# Patient Record
Sex: Male | Born: 1965 | Race: Black or African American | Hispanic: No | Marital: Single | Smoking: Never smoker
Health system: Southern US, Community
[De-identification: ages and names within clinical notes are randomized; demographics above are authoritative.]

## PROBLEM LIST (undated history)

## (undated) HISTORY — PX: OTHER SURGICAL HISTORY: SHX169

---

## 2014-12-11 DIAGNOSIS — Z87828 Personal history of other (healed) physical injury and trauma: Secondary | ICD-10-CM

## 2014-12-11 DIAGNOSIS — G3184 Mild cognitive impairment, so stated: Secondary | ICD-10-CM

## 2014-12-11 DIAGNOSIS — Z87820 Personal history of traumatic brain injury: Secondary | ICD-10-CM

## 2014-12-11 DIAGNOSIS — Z8679 Personal history of other diseases of the circulatory system: Secondary | ICD-10-CM

## 2014-12-11 HISTORY — DX: Mild cognitive impairment of uncertain or unknown etiology: G31.84

## 2014-12-11 HISTORY — DX: Personal history of traumatic brain injury: Z87.820

## 2014-12-11 HISTORY — DX: Personal history of other (healed) physical injury and trauma: Z87.828

## 2014-12-11 HISTORY — DX: Personal history of other diseases of the circulatory system: Z86.79

## 2015-11-25 ENCOUNTER — Encounter (HOSPITAL_COMMUNITY): Payer: Self-pay | Admitting: Emergency Medicine

## 2015-11-25 ENCOUNTER — Inpatient Hospital Stay (HOSPITAL_COMMUNITY)
Admission: EM | Admit: 2015-11-25 | Discharge: 2015-12-01 | DRG: 084 | Disposition: A | Discharge status: Home / Self Care | Payer: Self-pay | Attending: General Surgery | Admitting: General Surgery

## 2015-11-25 ENCOUNTER — Emergency Department (HOSPITAL_COMMUNITY): Payer: Self-pay

## 2015-11-25 DIAGNOSIS — S098XXA Other specified injuries of head, initial encounter: Secondary | ICD-10-CM | POA: Diagnosis present

## 2015-11-25 DIAGNOSIS — S069XAA Unspecified intracranial injury with loss of consciousness status unknown, initial encounter: Secondary | ICD-10-CM | POA: Diagnosis present

## 2015-11-25 DIAGNOSIS — R400 Somnolence: Secondary | ICD-10-CM | POA: Diagnosis present

## 2015-11-25 DIAGNOSIS — W19XXXA Unspecified fall, initial encounter: Secondary | ICD-10-CM | POA: Diagnosis present

## 2015-11-25 DIAGNOSIS — S065XAA Traumatic subdural hemorrhage with loss of consciousness status unknown, initial encounter: Secondary | ICD-10-CM

## 2015-11-25 DIAGNOSIS — S066X9A Traumatic subarachnoid hemorrhage with loss of consciousness of unspecified duration, initial encounter: Secondary | ICD-10-CM | POA: Diagnosis present

## 2015-11-25 DIAGNOSIS — S0101XA Laceration without foreign body of scalp, initial encounter: Secondary | ICD-10-CM | POA: Diagnosis present

## 2015-11-25 DIAGNOSIS — S065X9A Traumatic subdural hemorrhage with loss of consciousness of unspecified duration, initial encounter: Principal | ICD-10-CM | POA: Diagnosis present

## 2015-11-25 DIAGNOSIS — R10819 Abdominal tenderness, unspecified site: Secondary | ICD-10-CM

## 2015-11-25 DIAGNOSIS — I609 Nontraumatic subarachnoid hemorrhage, unspecified: Secondary | ICD-10-CM

## 2015-11-25 DIAGNOSIS — S069X9A Unspecified intracranial injury with loss of consciousness of unspecified duration, initial encounter: Secondary | ICD-10-CM | POA: Diagnosis present

## 2015-11-25 DIAGNOSIS — R413 Other amnesia: Secondary | ICD-10-CM | POA: Diagnosis present

## 2015-11-25 DIAGNOSIS — H539 Unspecified visual disturbance: Secondary | ICD-10-CM

## 2015-11-25 MED ORDER — TETANUS-DIPHTH-ACELL PERTUSSIS 5-2.5-18.5 LF-MCG/0.5 IM SUSP: 0.5000 mL | Freq: Once | INTRAMUSCULAR | Status: DC

## 2015-11-25 MED ORDER — SODIUM CHLORIDE 0.9 % IV BOLUS (SEPSIS)
1000.0000 mL | Freq: Once | INTRAVENOUS | Status: AC
  Administered 2015-11-26: 1000 mL via INTRAVENOUS

## 2015-11-25 NOTE — ED Provider Notes (Signed)
CSN: [id number]     Arrival date & time 11/25/15  2348 History  By signing my name below, I, Bradley Knox, attest that this documentation has been prepared under the direction and in the presence of [name], MD. Electronically Signed: [name], ED Scribe. 11/26/2015. 1:12 AM.   Chief Complaint  Patient presents with  . Altered Mental Status  . Loss of Consciousness   Level V caveat secondary to AMS.  The history is provided by the patient and the EMS personnel. No language interpreter was used.   Brought in by EMS, Bradley Knox. is a 49 y.o. male who presents to the Emergency Department complaining of AMS after being found unresponsive outside of his work. Pt has a laceration at the posterior scalp. EMS states that the pt initially reported that he fell, here he states that he cut himself shaving. Per EMS, the pt initially had pinpoint pupils and was sleepy with no significant change after 2 mg of Narcan. Pt denies taking any blood thinners. He also denies alcohol and drug use tonight.   History reviewed. No pertinent past medical history. History reviewed. No pertinent past surgical history. No family history on file. Social History  Substance Use Topics  . Smoking status: Never Smoker   . Smokeless tobacco: None  . Alcohol Use: No    Review of Systems  Unable to perform ROS: Mental status change    Allergies  Review of patient's allergies indicates no known allergies.  Home Medications   Prior to Admission medications   Not on File   SpO2 96% Physical Exam  Constitutional: Vital signs are normal. He appears well-developed and well-nourished.  Non-toxic appearance. He does not appear ill. No distress.  HENT:  Head: Normocephalic.  Nose: Rhinorrhea present.  Mouth/Throat: Oropharynx is clear and moist. No oropharyngeal exudate.  5 cm laceration to posterior scalp with small hematoma. No active bleeding.  Eyes: Conjunctivae and EOM are normal. Pupils are  equal, round, and reactive to light. No scleral icterus.  Neck: Normal range of motion. Neck supple. No tracheal deviation, no edema, no erythema and normal range of motion present. No thyroid mass and no thyromegaly present.  Cardiovascular: Normal rate, regular rhythm, S1 normal, S2 normal, normal heart sounds, intact distal pulses and normal pulses.  Exam reveals no gallop and no friction rub.   No murmur heard. Pulmonary/Chest: Effort normal and breath sounds normal. No respiratory distress. He has no wheezes. He has no rhonchi. He has no rales.  Abdominal: Soft. Normal appearance and bowel sounds are normal. He exhibits no distension, no ascites and no mass. There is no hepatosplenomegaly. There is no tenderness. There is no rebound, no guarding and no CVA tenderness.  Musculoskeletal: Normal range of motion. He exhibits no edema or tenderness.  Lymphadenopathy:    He has no cervical adenopathy.  Neurological: He is alert. He has normal strength. No cranial nerve deficit or sensory deficit.  Drowsy but easily arousable and able to follow all commands and moves all extremities  Skin: Skin is warm, dry and intact. No petechiae and no rash noted. He is not diaphoretic. No erythema. No pallor.  Psychiatric: He has a normal mood and affect. His behavior is normal. Judgment normal.  Nursing note and vitals reviewed.   ED Course  Procedures (including critical care time) DIAGNOSTIC STUDIES: Oxygen Saturation is 96% on RA,  normal by my interpretation.    COORDINATION OF CARE: 11:56 PM Treatment plan includes lab work, EKG,  CT head, CXR, and Tdap .  1:05 AM-Consult complete with Dr. Venetia Maxon (Neurosurgery). Patient case explained and discussed. Call ended at 1:05 AM.  1:11 AM-Consult complete with Dr. Janee Morn (General Surgery). Patient case explained and discussed. Agrees to admit patient for further evaluation and treatment. Call ended at 1:12 AM.   Labs Review Labs Reviewed  CBC WITH  DIFFERENTIAL/PLATELET - Abnormal; Notable for the following:    WBC 14.0 (*)    Neutro Abs 8.7 (*)    Lymphs Abs 4.6 (*)    All other components within normal limits  COMPREHENSIVE METABOLIC PANEL - Abnormal; Notable for the following:    Potassium 3.1 (*)    Glucose, Bld 203 (*)    Calcium 8.8 (*)    All other components within normal limits  ACETAMINOPHEN LEVEL - Abnormal; Notable for the following:    Acetaminophen (Tylenol), Serum <10 (*)    All other components within normal limits  I-STAT CG4 LACTIC ACID, ED - Abnormal; Notable for the following:    Lactic Acid, Venous 2.30 (*)    All other components within normal limits  LIPASE, BLOOD  PROTIME-INR  ETHANOL  SALICYLATE LEVEL  URINALYSIS, ROUTINE W REFLEX MICROSCOPIC (NOT AT [facility])  URINE RAPID DRUG SCREEN, HOSP PERFORMED  I-STAT TROPOININ, ED  CBG MONITORING, ED    Imaging Review Ct Head Wo Contrast  11/26/2015  CLINICAL DATA:  Patient found unresponsive, with scalp laceration. Initial encounter. EXAM: CT HEAD WITHOUT CONTRAST TECHNIQUE: Contiguous axial images were obtained from the base of the skull through the vertex without intravenous contrast. COMPARISON:  None. FINDINGS: There appears to be minimal subdural blood tracking overlying the left frontal and temporal lobes, measuring up to 4 mm in thickness, and minimal subdural blood along the anterior falx cerebri. There is also minimal subarachnoid blood at the left frontal lobe. This is compatible with contrecoup injury. No significant mass effect or midline shift is seen at this time. The posterior fossa, including the cerebellum, brainstem and fourth ventricle, is within normal limits. The third and lateral ventricles, and basal ganglia are unremarkable in appearance. There is no evidence of fracture; visualized osseous structures are unremarkable in appearance. The orbits are within normal limits. The paranasal sinuses and mastoid air cells are well-aerated. Soft tissue  swelling is noted overlying the high right posterior parietal calvarium. IMPRESSION: 1. Minimal subdural blood overlying the left frontal and temporal lobes, measuring up to 4 mm in thickness, and likely minimal subdural blood along the anterior falx cerebri. 2. Minimal subarachnoid blood at the left frontal lobe. Findings are compatible with contrecoup injury, given the right posterior parietal scalp wound. 3. Soft tissue swelling overlying the high right posterior parietal calvarium. Critical Value/emergent results were called by telephone at the time of interpretation on 11/26/2015 at 12:27 am to Dr. [name], who verbally acknowledged these results. Electronically Signed   By: Roanna Raider M.D.   On: 11/26/2015 00:29   Dg Chest Portable 1 View  11/26/2015  CLINICAL DATA:  Altered level of consciousness, acute onset. Initial encounter. EXAM: PORTABLE CHEST 1 VIEW COMPARISON:  None. FINDINGS: The lungs are well-aerated and clear. There is no evidence of focal opacification, pleural effusion or pneumothorax. The cardiomediastinal silhouette is borderline normal in size. No acute osseous abnormalities are seen. IMPRESSION: No acute cardiopulmonary process seen. Electronically Signed   By: Roanna Raider M.D.   On: 11/26/2015 00:34   I have personally reviewed and evaluated these images and lab results as part  of my medical decision-making.   EKG Interpretation   Date/Time:  Thursday November 25 2015 23:54:55 EST Ventricular Rate:  83 PR Interval:  189 QRS Duration: 91 QT Interval:  384 QTC Calculation: 451 R Axis:   77 Text Interpretation:  Sinus rhythm Left ventricular hypertrophy No old  tracing to compare Confirmed by Erroll Lunani, Abdulraheem Pineo Ayokunle [phone]([id number]) on  11/26/2015 12:05:23 AM      MDM   Final diagnoses:  None    00:02- Patient presents to the ED after EMS found him unresponsive outside of his work.  I have concern for intracranial injury and will obtain stat CT scan.  Tetanus  updated.  Labs pending.  01:26 - Labs are unremarkable.  CT scan shows SDH and SAH.  Dr. Noland FordyceStern recs for admission to gen surg, Dr. Janee Mornhompson has accepted the patient.  LACERATION REPAIR Performed by: Tomasita CrumbleNI,Bret Stamour Authorized byTomasita Crumble: Danielle Lento Consent: Verbal consent obtained. Risks and benefits: risks, benefits and alternatives were discussed Consent given by: patient Patient identity confirmed: provided demographic data Prepped and Draped in normal sterile fashion Wound explored  Laceration Location: posterior scalp  Laceration Length: 5cm  No Foreign Bodies seen or palpated   Irrigation method: syringe Amount of cleaning: standard  Skin closure: staples  Number of staples: 2   Patient tolerance: Patient tolerated the procedure well with no immediate complications.   CRITICAL CARE Performed by: Tomasita CrumbleNI,Joanell Cressler   Total critical care time: 40 minutes - SDH, SAH  Critical care time was exclusive of separately billable procedures and treating other patients.  Critical care was necessary to treat or prevent imminent or life-threatening deterioration.  Critical care was time spent personally by me on the following activities: development of treatment plan with patient and/or surrogate as well as nursing, discussions with consultants, evaluation of patient's response to treatment, examination of patient, obtaining history from patient or surrogate, ordering and performing treatments and interventions, ordering and review of laboratory studies, ordering and review of radiographic studies, pulse oximetry and re-evaluation of patient's condition.    I personally performed the services described in this documentation, which was scribed in my presence. The recorded information has been reviewed and is accurate.     Tomasita CrumbleAdeleke Gaelyn Tukes, MD 11/26/15 [id number]
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
## 2015-11-26 ENCOUNTER — Emergency Department (HOSPITAL_COMMUNITY): Payer: Self-pay

## 2015-11-26 ENCOUNTER — Inpatient Hospital Stay (HOSPITAL_COMMUNITY): Payer: MEDICAID

## 2015-11-26 ENCOUNTER — Inpatient Hospital Stay (HOSPITAL_COMMUNITY): Payer: Self-pay

## 2015-11-26 DIAGNOSIS — S069X9A Unspecified intracranial injury with loss of consciousness of unspecified duration, initial encounter: Secondary | ICD-10-CM | POA: Diagnosis present

## 2015-11-26 DIAGNOSIS — S069XAA Unspecified intracranial injury with loss of consciousness status unknown, initial encounter: Secondary | ICD-10-CM | POA: Diagnosis present

## 2015-11-26 LAB — CBC
HCT: 38.4 % — ABNORMAL LOW (ref 39.0–52.0)
Hemoglobin: 12.9 g/dL — ABNORMAL LOW (ref 13.0–17.0)
MCH: 29.7 pg (ref 26.0–34.0)
MCHC: 33.6 g/dL (ref 30.0–36.0)
MCV: 88.3 fL (ref 78.0–100.0)
PLATELETS: 139 10*3/uL — AB (ref 150–400)
RBC: 4.35 MIL/uL (ref 4.22–5.81)
RDW: 12.8 % (ref 11.5–15.5)
WBC: 13 10*3/uL — AB (ref 4.0–10.5)

## 2015-11-26 LAB — COMPREHENSIVE METABOLIC PANEL
ALK PHOS: 58 U/L (ref 38–126)
ALT: 21 U/L (ref 17–63)
ANION GAP: 10 (ref 5–15)
AST: 30 U/L (ref 15–41)
Albumin: 4 g/dL (ref 3.5–5.0)
BUN: 13 mg/dL (ref 6–20)
CALCIUM: 8.8 mg/dL — AB (ref 8.9–10.3)
CHLORIDE: 101 mmol/L (ref 101–111)
CO2: 25 mmol/L (ref 22–32)
CREATININE: 1 mg/dL (ref 0.61–1.24)
Glucose, Bld: 203 mg/dL — ABNORMAL HIGH (ref 65–99)
Potassium: 3.1 mmol/L — ABNORMAL LOW (ref 3.5–5.1)
Sodium: 136 mmol/L (ref 135–145)
Total Bilirubin: 1.2 mg/dL (ref 0.3–1.2)
Total Protein: 7 g/dL (ref 6.5–8.1)

## 2015-11-26 LAB — CBC WITH DIFFERENTIAL/PLATELET
Basophils Absolute: 0 10*3/uL (ref 0.0–0.1)
Basophils Relative: 0 %
EOS PCT: 1 %
Eosinophils Absolute: 0.1 10*3/uL (ref 0.0–0.7)
HCT: 39.7 % (ref 39.0–52.0)
Hemoglobin: 13.3 g/dL (ref 13.0–17.0)
LYMPHS ABS: 4.6 10*3/uL — AB (ref 0.7–4.0)
LYMPHS PCT: 33 %
MCH: 29.8 pg (ref 26.0–34.0)
MCHC: 33.5 g/dL (ref 30.0–36.0)
MCV: 89 fL (ref 78.0–100.0)
MONOS PCT: 5 %
Monocytes Absolute: 0.7 10*3/uL (ref 0.1–1.0)
Neutro Abs: 8.7 10*3/uL — ABNORMAL HIGH (ref 1.7–7.7)
Neutrophils Relative %: 61 %
PLATELETS: 152 10*3/uL (ref 150–400)
RBC: 4.46 MIL/uL (ref 4.22–5.81)
RDW: 12.7 % (ref 11.5–15.5)
WBC: 14 10*3/uL — AB (ref 4.0–10.5)

## 2015-11-26 LAB — BASIC METABOLIC PANEL
ANION GAP: 9 (ref 5–15)
BUN: 10 mg/dL (ref 6–20)
CALCIUM: 8.6 mg/dL — AB (ref 8.9–10.3)
CO2: 26 mmol/L (ref 22–32)
CREATININE: 0.88 mg/dL (ref 0.61–1.24)
Chloride: 99 mmol/L — ABNORMAL LOW (ref 101–111)
Glucose, Bld: 141 mg/dL — ABNORMAL HIGH (ref 65–99)
Potassium: 3 mmol/L — ABNORMAL LOW (ref 3.5–5.1)
SODIUM: 134 mmol/L — AB (ref 135–145)

## 2015-11-26 LAB — URINALYSIS, ROUTINE W REFLEX MICROSCOPIC
BILIRUBIN URINE: NEGATIVE
GLUCOSE, UA: NEGATIVE mg/dL
KETONES UR: 40 mg/dL — AB
Leukocytes, UA: NEGATIVE
Nitrite: NEGATIVE
PH: 7.5 (ref 5.0–8.0)
Protein, ur: NEGATIVE mg/dL
Specific Gravity, Urine: 1.035 — ABNORMAL HIGH (ref 1.005–1.030)

## 2015-11-26 LAB — PROTIME-INR
INR: 1.11 (ref 0.00–1.49)
PROTHROMBIN TIME: 14.5 s (ref 11.6–15.2)

## 2015-11-26 LAB — I-STAT TROPONIN, ED: Troponin i, poc: 0 ng/mL (ref 0.00–0.08)

## 2015-11-26 LAB — I-STAT CG4 LACTIC ACID, ED: LACTIC ACID, VENOUS: 2.3 mmol/L — AB (ref 0.5–2.0)

## 2015-11-26 LAB — URINE MICROSCOPIC-ADD ON

## 2015-11-26 LAB — LIPASE, BLOOD: LIPASE: 23 U/L (ref 11–51)

## 2015-11-26 LAB — RAPID URINE DRUG SCREEN, HOSP PERFORMED
Amphetamines: NOT DETECTED
BARBITURATES: NOT DETECTED
Benzodiazepines: NOT DETECTED
Cocaine: NOT DETECTED
Opiates: NOT DETECTED
Tetrahydrocannabinol: NOT DETECTED

## 2015-11-26 LAB — MRSA PCR SCREENING: MRSA by PCR: NEGATIVE

## 2015-11-26 LAB — ETHANOL

## 2015-11-26 LAB — ACETAMINOPHEN LEVEL

## 2015-11-26 LAB — SALICYLATE LEVEL

## 2015-11-26 MED ORDER — POTASSIUM CHLORIDE IN NACL 20-0.9 MEQ/L-% IV SOLN
INTRAVENOUS | Status: DC
  Administered 2015-11-26: 04:00:00 via INTRAVENOUS
  Filled 2015-11-26 (×2): qty 1000

## 2015-11-26 MED ORDER — ONDANSETRON HCL 4 MG/2ML IJ SOLN
4.0000 mg | Freq: Once | INTRAMUSCULAR | Status: AC
  Administered 2015-11-26: 4 mg via INTRAMUSCULAR

## 2015-11-26 MED ORDER — POTASSIUM CHLORIDE 10 MEQ/50ML IV SOLN: 10.0000 meq | INTRAVENOUS | Status: DC

## 2015-11-26 MED ORDER — OXYCODONE HCL 5 MG PO TABS
10.0000 mg | ORAL_TABLET | ORAL | Status: DC | PRN
  Administered 2015-11-28 – 2015-12-01 (×8): 10 mg via ORAL
  Filled 2015-11-26 (×8): qty 2

## 2015-11-26 MED ORDER — POTASSIUM CHLORIDE 10 MEQ/100ML IV SOLN
10.0000 meq | INTRAVENOUS | Status: DC
  Administered 2015-11-26: 10 meq via INTRAVENOUS
  Filled 2015-11-26: qty 100

## 2015-11-26 MED ORDER — PANTOPRAZOLE SODIUM 40 MG IV SOLR
40.0000 mg | Freq: Every day | INTRAVENOUS | Status: DC
  Administered 2015-11-27 – 2015-11-28 (×2): 40 mg via INTRAVENOUS
  Filled 2015-11-26 (×2): qty 40

## 2015-11-26 MED ORDER — ONDANSETRON HCL 4 MG/2ML IJ SOLN: 4.0000 mg | Freq: Four times a day (QID) | INTRAMUSCULAR | Status: DC | PRN

## 2015-11-26 MED ORDER — POTASSIUM CHLORIDE CRYS ER 10 MEQ PO TBCR
30.0000 meq | EXTENDED_RELEASE_TABLET | Freq: Once | ORAL | Status: AC
  Administered 2015-11-26: 30 meq via ORAL
  Filled 2015-11-26: qty 1

## 2015-11-26 MED ORDER — IOHEXOL 300 MG/ML  SOLN
100.0000 mL | Freq: Once | INTRAMUSCULAR | Status: AC | PRN
  Administered 2015-11-26: 100 mL via INTRAVENOUS

## 2015-11-26 MED ORDER — POTASSIUM CHLORIDE IN NACL 20-0.9 MEQ/L-% IV SOLN
INTRAVENOUS | Status: DC
  Administered 2015-11-29: 02:00:00 via INTRAVENOUS
  Filled 2015-11-26 (×6): qty 1000

## 2015-11-26 MED ORDER — ACETAMINOPHEN 325 MG PO TABS: 650.0000 mg | ORAL_TABLET | ORAL | Status: DC | PRN

## 2015-11-26 MED ORDER — OXYCODONE HCL 5 MG PO TABS
5.0000 mg | ORAL_TABLET | ORAL | Status: DC | PRN
  Administered 2015-11-27 – 2015-12-01 (×5): 5 mg via ORAL
  Filled 2015-11-26 (×7): qty 1

## 2015-11-26 MED ORDER — POTASSIUM CHLORIDE CRYS ER 20 MEQ PO TBCR
30.0000 meq | EXTENDED_RELEASE_TABLET | Freq: Two times a day (BID) | ORAL | Status: AC
  Administered 2015-11-26 – 2015-11-27 (×2): 30 meq via ORAL
  Filled 2015-11-26 (×2): qty 1

## 2015-11-26 MED ORDER — ONDANSETRON HCL 4 MG PO TABS: 4.0000 mg | ORAL_TABLET | Freq: Four times a day (QID) | ORAL | Status: DC | PRN

## 2015-11-26 MED ORDER — MORPHINE SULFATE (PF) 2 MG/ML IV SOLN
2.0000 mg | INTRAVENOUS | Status: DC | PRN
  Administered 2015-11-26 – 2015-11-30 (×9): 2 mg via INTRAVENOUS
  Filled 2015-11-26 (×9): qty 1

## 2015-11-26 MED ORDER — PANTOPRAZOLE SODIUM 40 MG PO TBEC
40.0000 mg | DELAYED_RELEASE_TABLET | Freq: Every day | ORAL | Status: DC
  Administered 2015-11-29 – 2015-12-01 (×3): 40 mg via ORAL
  Filled 2015-11-26 (×4): qty 1

## 2015-11-26 MED ORDER — MUPIROCIN 2 % EX OINT
TOPICAL_OINTMENT | CUTANEOUS | Status: AC
  Filled 2015-11-26: qty 22

## 2015-11-26 MED ORDER — BACITRACIN-NEOMYCIN-POLYMYXIN OINTMENT TUBE
1.0000 "application " | TOPICAL_OINTMENT | Freq: Every day | CUTANEOUS | Status: DC
  Administered 2015-11-26 – 2015-12-01 (×6): 1 via TOPICAL
  Filled 2015-11-26 (×2): qty 15

## 2015-11-26 MED ORDER — SODIUM CHLORIDE 0.9 % IV SOLN
500.0000 mg | Freq: Two times a day (BID) | INTRAVENOUS | Status: DC
  Administered 2015-11-26 – 2015-11-29 (×6): 500 mg via INTRAVENOUS
  Filled 2015-11-26 (×10): qty 5

## 2015-11-26 MED ORDER — SODIUM CHLORIDE 0.9 % IV SOLN
1000.0000 mg | Freq: Once | INTRAVENOUS | Status: AC
  Administered 2015-11-26: 1000 mg via INTRAVENOUS
  Filled 2015-11-26: qty 10
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
## 2015-11-26 NOTE — Progress Notes (Signed)
CT reviewed Expected evolution of left frontal contusion No need to repeat CT again unless neurological worsening Continue Keppra for 7 days
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
## 2015-11-26 NOTE — Progress Notes (Signed)
SLP Cancellation Note  Patient Details Name: Janene HarveyRay Loring Jr. MRN: [id number] DOB: [birth year]   Cancelled treatment:       Reason Eval/Treat Not Completed: pt in procedure in room   Blenda MountsCouture, Norine Reddington Laurice 11/26/2015, 3:37 PM
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
## 2015-11-26 NOTE — Progress Notes (Signed)
Bradley Knox, sister, called gave this nurse date of birth and background. Confirmed relationship with pt. Pt stated it was ok to update his sister. Sister updated, best contact is [phone] (cell)

## 2015-11-26 NOTE — ED Notes (Signed)
Call phone and ups ID given to [employer] at this time. Will bring to floor once finished. All other pt belongings taken to floor with pt.l

## 2015-11-26 NOTE — Plan of Care (Signed)
Problem: Phase I Progression Outcomes Goal: Pain controlled with appropriate interventions Outcome: Completed/Met Date Met:  11/26/15 Patient refuses all pain medications.
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
## 2015-11-27 LAB — CBC WITH DIFFERENTIAL/PLATELET
Basophils Absolute: 0 10*3/uL (ref 0.0–0.1)
Basophils Relative: 0 %
Eosinophils Absolute: 0 10*3/uL (ref 0.0–0.7)
Eosinophils Relative: 0 %
HEMATOCRIT: 37.6 % — AB (ref 39.0–52.0)
Hemoglobin: 13.1 g/dL (ref 13.0–17.0)
LYMPHS PCT: 14 %
Lymphs Abs: 1.4 10*3/uL (ref 0.7–4.0)
MCH: 30.7 pg (ref 26.0–34.0)
MCHC: 34.8 g/dL (ref 30.0–36.0)
MCV: 88.1 fL (ref 78.0–100.0)
MONO ABS: 0.9 10*3/uL (ref 0.1–1.0)
MONOS PCT: 9 %
NEUTROS ABS: 7.8 10*3/uL — AB (ref 1.7–7.7)
Neutrophils Relative %: 77 %
Platelets: 140 10*3/uL — ABNORMAL LOW (ref 150–400)
RBC: 4.27 MIL/uL (ref 4.22–5.81)
RDW: 13 % (ref 11.5–15.5)
WBC: 10.1 10*3/uL (ref 4.0–10.5)

## 2015-11-27 LAB — BASIC METABOLIC PANEL
ANION GAP: 6 (ref 5–15)
BUN: 8 mg/dL (ref 6–20)
CALCIUM: 8.8 mg/dL — AB (ref 8.9–10.3)
CO2: 25 mmol/L (ref 22–32)
Chloride: 103 mmol/L (ref 101–111)
Creatinine, Ser: 0.96 mg/dL (ref 0.61–1.24)
GFR calc Af Amer: >60 mL/min (ref >60)
GFR calc non Af Amer: >60 mL/min (ref >60)
GLUCOSE: 124 mg/dL — AB (ref 65–99)
POTASSIUM: 4 mmol/L (ref 3.5–5.1)
Sodium: 134 mmol/L — ABNORMAL LOW (ref 135–145)
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
## 2015-11-28 ENCOUNTER — Inpatient Hospital Stay (HOSPITAL_COMMUNITY): Payer: Self-pay
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
## 2015-11-29 ENCOUNTER — Inpatient Hospital Stay (HOSPITAL_COMMUNITY): Payer: Self-pay

## 2015-11-29 ENCOUNTER — Encounter (HOSPITAL_COMMUNITY): Payer: Self-pay | Admitting: Radiology

## 2015-11-29 MED ORDER — LEVETIRACETAM 500 MG PO TABS
500.0000 mg | ORAL_TABLET | Freq: Two times a day (BID) | ORAL | Status: DC
  Administered 2015-11-29 – 2015-12-01 (×4): 500 mg via ORAL
  Filled 2015-11-29 (×4): qty 1
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
## 2015-11-29 NOTE — Progress Notes (Signed)
Patient ID: Bradley Harveyay Stanback Jr., male   DOB: 1966/05/21, 49 y.o.   MRN: [id number]    Subjective: No complaints, remains amnestic to event  Objective: Vital signs in last 24 hours: Temp:  [98.2 F (36.8 C)-99.1 F (37.3 C)] 98.2 F (36.8 C) (12/19 0602) Pulse Rate:  [65-78] 66 (12/19 0602) Resp:  [18] 18 (12/19 0602) BP: (105-156)/(57-72) 156/57 mmHg (12/19 0602) SpO2:  [96 %-99 %] 96 % (12/19 0602) Last BM Date: 11/26/15  Intake/Output from previous day: 12/18 0701 - 12/19 0700 In: -  Out: 600 [Urine:600] Intake/Output this shift:    General appearance: cooperative Head: abrasion/lac post scalp with minimal drainage Resp: clear to auscultation bilaterally Cardio: regular rate and rhythm GI: soft, NT, ND Neuro: awake, F/C, speech fluent, MAE  Lab Results: CBC   Recent Labs  11/27/15 0413  WBC 10.1  HGB 13.1  HCT 37.6*  PLT 140*   BMET  Recent Labs  11/27/15 0413  NA 134*  K 4.0  CL 103  CO2 25  GLUCOSE 124*  BUN 8  CREATININE 0.96  CALCIUM 8.8*   PT/INR No results for input(s): LABPROT, INR in the last 72 hours. ABG No results for input(s): PHART, HCO3 in the last 72 hours.  Invalid input(s): PCO2, PO2  Studies/Results: Ct Head Wo Contrast  11/28/2015  CLINICAL DATA:  Subdural hematoma. New onset visual changes and posterior headache. EXAM: CT HEAD WITHOUT CONTRAST TECHNIQUE: Contiguous axial images were obtained from the base of the skull through the vertex without intravenous contrast. COMPARISON:  11/26/2015 FINDINGS: The left frontal lobe hemorrhagic contusion measures 8 mm and is unchanged when compared with the previous exam, image number 7 of series 2. Surrounding low attenuation edema is again noted. The adjacent subarachnoid hemorrhage is stable to improved in the interval. Improved appearance of subdural hemorrhage overlying the left frontal and temporal lobes. No new areas of hemorrhage identified. No significant midline shift. Ventricular  volumes are normal. The paranasal sinuses are clear. The mastoid air cells are clear. Skin staples are noted overlying the posterior scalp. IMPRESSION: 1. Stable appearance of hemorrhagic contusion involving the left frontal lobe. 2. Decrease in subdural hemorrhage overlying the left frontal and temporal lobes. 3. No new areas of hemorrhage and no evidence for acute brain infarct. Electronically Signed   By: Signa Kellaylor  Stroud M.D.   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 Keppra for 7d per NS Posterior scalp lac/abrasion - closed by ED. Local care. FEN - advance to reg diet and SL IV VTE - PAS, will check with NS regarding Lovenox DIspo - he does not have anyone that can provide 24h assist. Continue therapies until he can safely go home alone.   LOS: 3 days    Violeta GelinasBurke Jaicee Michelotti, MD, MPH, FACS Trauma: [phone] General Surgery: [phone]  11/29/2015
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
## 2015-11-30 MED ORDER — ENOXAPARIN SODIUM 40 MG/0.4ML ~~LOC~~ SOLN
40.0000 mg | SUBCUTANEOUS | Status: DC
  Administered 2015-11-30 – 2015-12-01 (×2): 40 mg via SUBCUTANEOUS
  Filled 2015-11-30 (×2): qty 0.4
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
## 2015-11-30 NOTE — Progress Notes (Signed)
CT reviewed Stable/evolving intracranial hemorrhage Ok for prophylactic dose lovenox

## 2015-11-30 NOTE — Progress Notes (Signed)
Pt continues to complain about left eye pain that he has had since being admitted.  Pain medications given, and repeat CT was completed earlier tonight.  Will continue to monitor.   Estanislado EmmsAshley Schwarz, RN
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
## 2015-11-30 NOTE — Progress Notes (Addendum)
Patient has not been the most cooperative affect with PT, OT and has become demanding concerning his pain medications showing signs of anger. His pain medications have been administered in a timely manner with momentary delays related to other staff being unavailable or myself being involved with other patients.

## 2015-11-30 NOTE — Progress Notes (Signed)
Trauma Service Note  Subjective: Patient complaining of eye pain.  Not sure wht is causing his eye pain.  Objective: Vital signs in last 24 hours: Temp:  [98.1 F (36.7 C)-98.5 F (36.9 C)] 98.2 F (36.8 C) (12/20 0522) Pulse Rate:  [54-60] 55 (12/20 0522) Resp:  [18] 18 (12/20 0127) BP: (110-136)/(60-80) 110/60 mmHg (12/20 0522) SpO2:  [97 %-100 %] 100 % (12/20 0522) Last BM Date: 11/26/15  Intake/Output from previous day: 12/19 0701 - 12/20 0700 In: -  Out: 600 [Urine:600] Intake/Output this shift:    General: No acute distress.  Lungs: Clear  Abd: Benign  Extremities: no changes and no clinical signs or symptoms of DVT  Neuro: Slow to respond.  Seems oriented.  Still having eye pain and headaches.  Lab Results: CBC  No results for input(s): WBC, HGB, HCT, PLT in the last 72 hours. BMET No results for input(s): NA, K, CL, CO2, GLUCOSE, BUN, CREATININE, CALCIUM in the last 72 hours. PT/INR No results for input(s): LABPROT, INR in the last 72 hours. ABG No results for input(s): PHART, HCO3 in the last 72 hours.  Invalid input(s): PCO2, PO2  Studies/Results: Ct Head Wo Contrast  11/29/2015  CLINICAL DATA:  Followup head trauma. EXAM: CT HEAD WITHOUT CONTRAST TECHNIQUE: Contiguous axial images were obtained from the base of the skull through the vertex without intravenous contrast. COMPARISON:  Yesterday FINDINGS: Right parietal scalp staples with diffusing scalp contusion. There is stable diastasis of the right lambdoid suture, nondepressed. Bilateral inferior frontal hemorrhagic contusion, more extensive on the left where there is a 9 mm stable parenchymal hemorrhage. Extra-axial hemorrhage seen at presentation is no longer visualized. No hydrocephalus, infarct, shift, or new site of hemorrhage. IMPRESSION: 1. No new/acute finding. 2. Stable inferior bifrontal hemorrhagic contusion. 3. Right lambdoid suture diastasis, nondepressed. Electronically Signed   By: Marnee SpringJonathon   Watts M.D.   On: 11/29/2015 22:45   Ct Head Wo Contrast  11/28/2015  CLINICAL DATA:  Subdural hematoma. New onset visual changes and posterior headache. EXAM: CT HEAD WITHOUT CONTRAST TECHNIQUE: Contiguous axial images were obtained from the base of the skull through the vertex without intravenous contrast. COMPARISON:  11/26/2015 FINDINGS: The left frontal lobe hemorrhagic contusion measures 8 mm and is unchanged when compared with the previous exam, image number 7 of series 2. Surrounding low attenuation edema is again noted. The adjacent subarachnoid hemorrhage is stable to improved in the interval. Improved appearance of subdural hemorrhage overlying the left frontal and temporal lobes. No new areas of hemorrhage identified. No significant midline shift. Ventricular volumes are normal. The paranasal sinuses are clear. The mastoid air cells are clear. Skin staples are noted overlying the posterior scalp. IMPRESSION: 1. Stable appearance of hemorrhagic contusion involving the left frontal lobe. 2. Decrease in subdural hemorrhage overlying the left frontal and temporal lobes. 3. No new areas of hemorrhage and no evidence for acute brain infarct. Electronically Signed   By: Signa Kellaylor  Stroud M.D.   On: 11/28/2015 17:18    Anti-infectives: Anti-infectives    None      Assessment/Plan: s/p  Need to progress in patient's recovery and try to get safe, adequate disposition.  LOS: 4 days   Marta LamasJames O. Gae BonWyatt, III, MD, FACS [phone] Trauma Surgeon 11/30/2015

## 2015-11-30 NOTE — Progress Notes (Signed)
PT Cancellation Note  Patient Details Name: Bradley HarveyRay Nishida Jr. MRN: [id number] DOB: [birth year]   Cancelled Treatment:    Reason Eval/Treat Not Completed: Patient reports "I just did all that walking and talking about numbers with the other person." Patient unable to recall their name and thought it was a PT. ?if OT has recently seen. Patient would not get OOB for further mobility training.   Bradley Knox 11/30/2015, 2:54 PM  Pager [phone]
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
## 2015-12-01 DIAGNOSIS — S098XXA Other specified injuries of head, initial encounter: Secondary | ICD-10-CM | POA: Diagnosis present

## 2015-12-01 DIAGNOSIS — S0101XA Laceration without foreign body of scalp, initial encounter: Secondary | ICD-10-CM | POA: Diagnosis present

## 2015-12-01 MED ORDER — LEVETIRACETAM 500 MG PO TABS: 500.0000 mg | ORAL_TABLET | Freq: Two times a day (BID) | ORAL | Status: DC

## 2015-12-01 MED ORDER — OXYCODONE-ACETAMINOPHEN 5-325 MG PO TABS: 1.0000 | ORAL_TABLET | ORAL | Status: AC | PRN

## 2015-12-01 NOTE — Progress Notes (Signed)
Discharge orders received, Pt for discharge. IV d/c'd. D/c instructions and RX given with verbalized understanding. . Staff bought pt downstairs via wheelchair. 12/01/15 1308
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
## 2015-12-01 NOTE — Progress Notes (Signed)
Physical Therapy Treatment Patient Details Name: Bradley Knox. MRN: [id number] DOB: 09/24/1966 Today's Date: 12/01/2015    History of Present Illness pt presents after being found down.  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  Only known PMH is ACDF shown on scans.      PT Comments    Patient continues with delayed response to cognitive tasks, however with brisk following of one step commands for Berg Balance testing (scored 51/56) and Dynamic Gait Index (22/24). 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    Follow Up Recommendations  Outpatient PT;Supervision - Intermittent (OP for return to work)     Equipment Recommendations  None recommended by PT    Recommendations for Other Services       Precautions / Restrictions Precautions Precautions: Fall Restrictions Weight Bearing Restrictions: No    Mobility  Bed Mobility Overal bed mobility: Modified Independent             General bed mobility comments: incr time; x2 due to returning to bed (with chair alarm beeping) while PT went to get blankets  Transfers Overall transfer level: Independent     Sit to Stand: Independent         General transfer comment: no imbalance, denies dizziness  Ambulation/Gait Ambulation/Gait assistance: Modified independent (Device/Increase time) Ambulation Distance (Feet): 300 Feet Assistive device: None Gait Pattern/deviations: Step-through pattern;Decreased stride length Gait velocity: 1.97 ft/sec Gait velocity interpretation: Below normal speed for age/gender General Gait Details: no drift; poor ability to vary speed up or down;    Stairs Stairs: Yes Stairs assistance: Independent Stair Management: No rails;Alternating pattern;Forwards Number of Stairs: 5    Wheelchair Mobility    Modified Rankin (Stroke Patients Only)       Balance     Sitting balance-Leahy Scale:  Good       Standing balance-Leahy Scale: Fair                 High Level Balance Comments: Able to walk tandem down straight line x 20 ft without imbalance    Cognition Arousal/Alertness: Awake/alert Behavior During Therapy: Flat affect Overall Cognitive Status: Impaired/Different from baseline Area of Impairment: Awareness;Rancho level;Attention;Following commands;Safety/judgement;Problem solving   Current Attention Level: Selective   Following Commands: Follows one step commands with increased time;Follows multi-step commands with increased time Safety/Judgement: Decreased awareness of deficits;Decreased awareness of safety Awareness: Emergent Problem Solving: Slow processing;Decreased initiation;Requires verbal cues General Comments: Slow processing, however ? also element of non-compliance (i.e. when doing tasks for Berg, his responses were fairly quick. When asked to answer questions (how are you? does your balance seem off compared to prior to injury?)    Exercises      General Comments        Pertinent Vitals/Pain Pain Assessment: Faces Faces Pain Scale: Hurts little more Pain Location: Lt eye Pain Descriptors / Indicators: Aching Pain Intervention(s): Limited activity within patient's tolerance;Monitored during session;Repositioned;Premedicated before session (pt reports he recently had meds)    Home Living                      Prior Function            PT Goals (current goals can now be found in the care plan section) Acute Rehab PT Goals Patient Stated Goal: to stop this headache PT Goal Formulation: With patient Time For Goal Achievement: 12/10/15 Potential to Achieve  Goals: Good Progress towards PT goals: Goals met and updated - see care plan    Frequency  Min 4X/week    PT Plan Discharge plan needs to be updated    Co-evaluation             End of Session Equipment Utilized During Treatment: Gait belt Activity Tolerance:  Patient limited by pain Patient left: in bed;with call bell/phone within reach;with bed alarm set;with SCD's reapplied     Time: 0822-0853 PT Time Calculation (min) (ACUTE ONLY): 31 min  Charges:  $Gait Training: 23-37 mins                    G Codes:      Bradley Knox 12-16-2015, 9:18 AM  Pager [phone]

## 2015-12-01 NOTE — Discharge Instructions (Signed)
Wash wounds daily in shower with soap and water. °Do not soak. °Apply antibiotic ointment (e.g. Neosporin) twice daily and as needed to keep moist. °Cover with dry dressing. ° °No driving while taking oxycodone. °

## 2015-12-01 NOTE — Progress Notes (Signed)
Patient ID: Bradley Harveyay Staron Jr., male   DOB: 04-30-1966, 49 y.o.   MRN: [id number]   LOS: 5 days   Subjective: Feels a little better.   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 Keppra for 7d per NS. Posterior scalp lac/abrasion - closed by ED. Local care. FEN - advance to reg diet and SL IV VTE - SCD's, Lovenox DIspo - D/C home    Freeman CaldronMichael J. Daniyal Tabor, PA-C Pager: [phone] General Trauma PA Pager: [phone]  12/01/2015

## 2015-12-01 NOTE — Clinical Social Work Note (Signed)
Clinical Social Worker continuing to follow patient for support and discharge planning needs.  Patient plans to discharge home today and feels comfortable with current discharge plan.  Patient with concerns regarding transportation needs - CSW provided patient with a bus pass for return home and several more bus passes to assist with outpatient therapy appointments.  Patient aware and appreciative.  RN to provide with patient discharge paperwork.  Clinical Social Worker will sign off for now as social work intervention is no longer needed. Please consult us again if new need arises.  Bradley Knox, [place] [phone]

## 2015-12-08 ENCOUNTER — Emergency Department (HOSPITAL_COMMUNITY)
Admission: EM | Admit: 2015-12-08 | Discharge: 2015-12-08 | Disposition: A | Discharge status: Home / Self Care | Payer: Self-pay | Attending: Emergency Medicine | Admitting: Emergency Medicine

## 2015-12-08 ENCOUNTER — Emergency Department (HOSPITAL_COMMUNITY): Payer: Self-pay

## 2015-12-08 ENCOUNTER — Encounter (HOSPITAL_COMMUNITY): Payer: Self-pay | Admitting: *Deleted

## 2015-12-08 DIAGNOSIS — M791 Myalgia: Secondary | ICD-10-CM | POA: Insufficient documentation

## 2015-12-08 DIAGNOSIS — R519 Headache, unspecified: Secondary | ICD-10-CM

## 2015-12-08 DIAGNOSIS — R51 Headache: Secondary | ICD-10-CM | POA: Insufficient documentation

## 2015-12-08 LAB — I-STAT CHEM 8, ED
BUN: 10 mg/dL (ref 6–20)
CHLORIDE: 101 mmol/L (ref 101–111)
Calcium, Ion: 1.07 mmol/L — ABNORMAL LOW (ref 1.12–1.23)
Creatinine, Ser: 1 mg/dL (ref 0.61–1.24)
Glucose, Bld: 109 mg/dL — ABNORMAL HIGH (ref 65–99)
HEMATOCRIT: 42 % (ref 39.0–52.0)
HEMOGLOBIN: 14.3 g/dL (ref 13.0–17.0)
POTASSIUM: 3.9 mmol/L (ref 3.5–5.1)
SODIUM: 140 mmol/L (ref 135–145)
TCO2: 29 mmol/L (ref 0–100)

## 2015-12-08 MED ORDER — OXYCODONE-ACETAMINOPHEN 5-325 MG PO TABS
1.0000 | ORAL_TABLET | Freq: Once | ORAL | Status: AC
  Administered 2015-12-08: 1 via ORAL
  Filled 2015-12-08: qty 1

## 2015-12-08 NOTE — Discharge Instructions (Signed)
Follow-up I her scheduled appointment this afternoon with the trauma clinic. Please return to the Emergency Department if symptoms worsen or new onset of fever, neck stiffness, visual changes, abdominal pain, vomiting, urinary symptoms, numbness, tingling, weakness, seizures, syncope.

## 2015-12-08 NOTE — ED Notes (Signed)
Patient called in main ED waiting area with no response 

## 2015-12-08 NOTE — ED Provider Notes (Signed)
CSN: [id number]     Arrival date & time 12/08/15  [id number] History   First MD Initiated Contact with Patient 12/08/15 1008     Chief Complaint  Patient presents with  . Headache  . Leg Pain     (Consider location/radiation/quality/duration/timing/severity/associated sxs/prior Treatment) HPI   Patient is a 49 year old male with no past medical history presents the emergency department with complaint of headache. Patient presented to the ED on 11/26/15 after EMS found him unresponsive outside of work, CT revealed SDH and SAH. Patient was admitted to general surgery, patient was observed during his admission, no surgery performed and he was discharged on 10/21. Patient reports he has had continued to have a posterior constant aching headache during his admission and after being discharged home. He states he has been taking 5 mg oxycodone he was prescribed with mild relief but notes he took his last pill this morning. Pt denies fever, neck stiffness, visual changes, photophobia, abdominal pain, N/V, urinary symptoms, numbness, tingling, weakness, seizures, syncope.  Denies any recent fall, trauma, injury. He also endorses having intermittent aching/cramping pain to bilateral legs for the past few days. Patient states he has a follow-up appointment scheduled with the trauma clinic this afternoon at 2:45.  History reviewed. No pertinent past medical history. History reviewed. No pertinent past surgical history. No family history on file. Social History  Substance Use Topics  . Smoking status: Never Smoker   . Smokeless tobacco: None  . Alcohol Use: No    Review of Systems  Musculoskeletal: Positive for myalgias.  Neurological: Positive for headaches.  All other systems reviewed and are negative.     Allergies  Review of patient's allergies indicates no known allergies.  Home Medications   Prior to Admission medications   Medication Sig Start Date End Date Taking? Authorizing Provider   levETIRAcetam (KEPPRA) 500 MG tablet Take 1 tablet (500 mg total) by mouth every 12 (twelve) hours. 12/01/15  Yes Freeman CaldronMichael J Jeffery, PA-C  oxyCODONE-acetaminophen (ROXICET) 5-325 MG tablet Take 1-2 tablets by mouth every 4 (four) hours as needed (Pain). 12/01/15  Yes Freeman CaldronMichael J Jeffery, PA-C   BP 120/71 mmHg  Pulse 85  Temp(Src) 98.5 F (36.9 C) (Oral)  Resp 20  Ht 5' 6.5" (1.689 m)  Wt 63.504 kg  BMI 22.26 kg/m2  SpO2 99% Physical Exam  Constitutional: He is oriented to person, place, and time. He appears well-developed and well-nourished. No distress.  HENT:  Head: Normocephalic and atraumatic.  Mouth/Throat: Oropharynx is clear and moist. No oropharyngeal exudate.  Eyes: Conjunctivae and EOM are normal. Pupils are equal, round, and reactive to light. Right eye exhibits no discharge. Left eye exhibits no discharge. No scleral icterus.  Neck: Normal range of motion. Neck supple.  Cardiovascular: Normal rate, regular rhythm, normal heart sounds and intact distal pulses.   Pulmonary/Chest: Effort normal and breath sounds normal. No respiratory distress. He has no wheezes. He has no rales. He exhibits no tenderness.  Abdominal: Soft. Bowel sounds are normal. He exhibits no distension and no mass. There is no tenderness. There is no rebound and no guarding.  Musculoskeletal: Normal range of motion. He exhibits no edema or tenderness.  BLE nontender to palpation. FROM of BLE with 5/5 strength, sensation intact. 2+ PT pulses. No lower extremity swelling.  Lymphadenopathy:    He has no cervical adenopathy.  Neurological: He is alert and oriented to person, place, and time. He has normal strength. No cranial nerve deficit or sensory deficit. Coordination normal.  Skin: Skin is warm and dry.  Nursing note and vitals reviewed.   ED Course  Procedures (including critical care time) Labs Review Labs Reviewed  I-STAT CHEM 8, ED - Abnormal; Notable for the following:    Glucose, Bld 109 (*)     Calcium, Ion 1.07 (*)    All other components within normal limits    Imaging Review Ct Head Wo Contrast  12/08/2015  CLINICAL DATA:  Continued symptoms after head trauma. EXAM: CT HEAD WITHOUT CONTRAST TECHNIQUE: Contiguous axial images were obtained from the base of the skull through the vertex without intravenous contrast. COMPARISON:  Most recent imaging 11/29/2015. Initial cranial imaging on 12/15 following unknown closed head injury with laceration. FINDINGS: Resolving bifrontal contusions, with persistent inferior frontal lobe edema on the LEFT. The edema is improved compared with most recent priors. Small LEFT frontal subdural collections, noted on the initial exam, have also resolved, without recurrence or development of subdural hygroma. No new parenchymal contusions, hydrocephalus, or midline shift. Calvarium remains intact. No sinus air-fluid level. Negative mastoids. No orbital findings. IMPRESSION: Resolving bifrontal contusions and resolve subdural blood collection. Persistent but improved LEFT frontal lobe edema sequelae of closed head injury. No new parenchymal hemorrhage or hydrocephalus. No occult skull fracture or sinus air-fluid level. Electronically Signed   By: Elsie Stain M.D.   On: 12/08/2015 11:47   I have personally reviewed and evaluated these images and lab results as part of my medical decision-making.   EKG Interpretation None      MDM   Final diagnoses:  Nonintractable headache, unspecified chronicity pattern, unspecified headache type    Patient presents with headache. He was seen in the emergency department on 12/16 and admitted for TBI, d/c home on 12/21. He states he had a continued headache during his admission when she states has remained constant since being discharged home. He states he has been taking his medications as prescribed for pain but notes he took his last oxycodone this morning. Patient has a follow-up appointment trauma clinic this  afternoon. VSS. Exam unremarkable, no neuro deficits. Labs unremarkable. CT head revealed resolving bifrontal contusions and resolving subdural blood collection, improved left frontal lobe edema sequelae of closed head injury. Discussed results and plan for discharge with patient. Advised patient to go to his scheduled follow-up appointment with the trauma clinic this afternoon after being discharged from the ED.  Evaluation does not show pathology requring ongoing emergent intervention or admission. Pt is hemodynamically stable and mentating appropriately. Discussed findings/results and plan with patient/guardian, who agrees with plan. All questions answered. Return precautions discussed and outpatient follow up given.      Satira Sark [place], [place] 12/08/15 2105  Bethann Berkshire, MD 12/09/15 1029

## 2015-12-08 NOTE — ED Notes (Signed)
Patient transported to CT 

## 2015-12-08 NOTE — ED Notes (Signed)
Pt reports continued head pain since discharge from the hospital recently for TBI/head bleed. Pt states that he is also having bilateral leg spasms. Pt alert and oriented in triage.

## 2015-12-10 ENCOUNTER — Encounter: Payer: Self-pay | Admitting: Rehabilitation

## 2015-12-10 ENCOUNTER — Ambulatory Visit: Payer: Self-pay | Admitting: Occupational Therapy

## 2015-12-10 ENCOUNTER — Encounter: Payer: Self-pay | Admitting: Occupational Therapy

## 2015-12-10 ENCOUNTER — Ambulatory Visit: Payer: Self-pay | Attending: Orthopedic Surgery | Admitting: Rehabilitation

## 2015-12-10 DIAGNOSIS — M79604 Pain in right leg: Secondary | ICD-10-CM | POA: Insufficient documentation

## 2015-12-10 DIAGNOSIS — S069X9A Unspecified intracranial injury with loss of consciousness of unspecified duration, initial encounter: Secondary | ICD-10-CM | POA: Insufficient documentation

## 2015-12-10 DIAGNOSIS — X58XXXA Exposure to other specified factors, initial encounter: Secondary | ICD-10-CM | POA: Insufficient documentation

## 2015-12-10 DIAGNOSIS — R4189 Other symptoms and signs involving cognitive functions and awareness: Secondary | ICD-10-CM

## 2015-12-10 DIAGNOSIS — R531 Weakness: Secondary | ICD-10-CM | POA: Insufficient documentation

## 2015-12-10 DIAGNOSIS — R269 Unspecified abnormalities of gait and mobility: Secondary | ICD-10-CM | POA: Insufficient documentation

## 2015-12-10 DIAGNOSIS — R2681 Unsteadiness on feet: Secondary | ICD-10-CM | POA: Insufficient documentation

## 2015-12-10 DIAGNOSIS — M79605 Pain in left leg: Secondary | ICD-10-CM | POA: Insufficient documentation

## 2015-12-10 NOTE — Therapy (Signed)
[facility] [address] [place], [place], [address] Phone: [phone]   Fax:  [phone]  Occupational Therapy Evaluation  Patient Details  Name: Bradley Knox. MRN: [id number] Date of Birth: [birth year] Referring Provider: Dr. Violeta Gelinas  Encounter Date: 12/10/2015      OT End of Session - 12/10/15 1057    Visit Number 1   Number of Visits 1   Authorization Type no insurance at this tme   OT Start Time 0930   OT Stop Time 1025   OT Time Calculation (min) 55 min   Activity Tolerance Patient tolerated treatment well      History reviewed. No pertinent past medical history.  History reviewed. No pertinent past surgical history.  There were no vitals filed for this visit.  Visit Diagnosis:  Impaired cognition      Subjective Assessment - 12/10/15 0935    Subjective  I am so cold.   Pertinent History pt found down outside at work - no witnesses.  TBI with [facility] and SDH   Patient Stated Goals I want the pain to go away and be able to manuever more.    Currently in Pain? Yes  See PT evaluation for details.            [facility] OT Assessment - 12/10/15 0937    Assessment   Diagnosis TBI - L frontal/temporal SDH and L frontal SAH   Referring Provider Dr. Violeta Gelinas   Onset Date 11/25/15   Prior Therapy acute therapiies only    Precautions   Precautions Fall   Restrictions   Weight Bearing Restrictions No   Balance Screen   Has the patient fallen in the past 6 months No   Home  Environment   Family/patient expects to be discharged to: Shelter/Homeless   Additional Comments Pt was living in an apartment however could not pay his rent after injury and pt contact [employer].     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  States he does not know when he can shower.   IADL   Shopping Takes care of all shopping needs independently   Light Housekeeping Does personal laundry completely   Meal Prep --  N/a pt staying at a shelter   [employer] independently on public transportation  pt is using bus passes from hospital but is running out.   Medication Management Is responsible for taking medication in correct dosages at correct time  pt is out of his Keppra - states he will call dr today   Financial Management --  pt living at shelter and has no true finances   Mobility   Mobility Status Independent   Written Expression   Dominant Hand Left   Vision - History   Baseline Vision Other (comment)  pt states he has appt in Feb to see eye dr (  unrelated)   Vision Assessment   Eye Alignment Impaired (comment)   Ocular Range of Motion Within Functional Limits   Tracking/Visual Pursuits Able to track stimulus in all quads without difficulty   Diplopia Assessment --  pt has mild double vision in L middle/superior fields   Activity Tolerance   Activity Tolerance Endurance does not limit participation in activity   Cognition   Overall Cognitive Status Impaired/Different from baseline   Mini Mental State Exam  MOCA= 19/30   Memory Impaired   Memory Impairment Retrieval deficit;Decreased recall of new information   Awareness Impaired   Awareness Impairment Anticipatory impairment   Problem Solving --  To be further assessed by ST   Executive Function --  higher level cognitive impairments   Observation/Other  Assessments   Observations Pt with higher level cognitive impairment however appears language based and pt was able to problem solve reaching out to [employer] as he was unable to pay his rent as well as to problem solve how to get informationr regarding follow up appointments. Pt very tangential when answering questions and appears to have some disorganization with thought when asked more complex verbal questions.     Sensation   Light Touch Appears Intact   Hot/Cold Appears Intact   Proprioception Appears Intact   Coordination   Gross Motor Movements are Fluid and Coordinated Yes   Fine Motor Movements are Fluid and Coordinated Yes   Finger Nose Finger Test WNL's   ROM / Strength   AROM / PROM / Strength AROM;Strength   AROM   Overall AROM  Within functional limits for tasks performed   Overall AROM Comments FOR UE'S   Strength   Overall Strength Within functional limits for tasks performed   Overall Strength Comments For UE's   Hand Function   Right Hand Gross Grasp Functional   Right Hand Grip (lbs) 50   Left Hand Gross Grasp Functional   Left Hand Grip (lbs) 55                           OT Short Term Goals - 12/10/15 1049    OT SHORT TERM GOAL #1   Title N/a           OT Long Term Goals - 12/10/15 1049    OT LONG TERM GOAL #1   Title N/a               Plan - 12/10/15 1049    Clinical Impression Statement Pt is 49 year old male found unresponsive outside of his place of his employment with apparent TBI, unknown cause. CT shows L temporal and frontal SDH as well as R frontal SAH. Pt was admitted to the hospital via the ED on 11/25/2015 and dicharge on 12/01/2015.  Pt is currently unable to pay his rent and is staying at [employer].  Pt presents today with higher level cogntiive deficits including memory (retrielval deficit), decreased new learning, tangential speech, impiared hgher level cognitive skills. Cognitive deficits  appear to be language based.  At this time pt does not appear to require OT services at this time however would benefit from ST eval. Pt in agreement. Contacted MD for ST order.  With pt's permission, contacted hospital based social worker to assist pt with support services as pt currently has no insurance ( states [employer] insurance will come thorugh by February), no ability to pay for transportation, no primary care MD  and no ability to pay for medications. Pt in agreement with plan.   Plan No further OT services warranted at this time.   Recommended Other Services ST therapy eval and tx - called and requested order from trauma MD    Consulted and Agree with Plan of Care Patient        Problem List Patient Active Problem List   Diagnosis Date Noted  . Blunt head trauma 12/01/2015  . Occipital scalp laceration 12/01/2015  . TBI (traumatic brain injury) (HCC) 11/26/2015    Norton PastelPulaski, Karen Halliday, OTR/L 12/10/2015, 10:58 AM  [facility] [address] [place], [place], [address] Phone: [phone]   Fax:  [phone]  Name: Bradley HarveyRay Sheeler Jr. MRN: [id number] Date of Birth: [birth year]

## 2015-12-10 NOTE — Therapy (Signed)
[facility] [address] [place], [place], [address] Phone: [phone]   Fax:  [phone]  Physical Therapy Evaluation  Patient Details  Name: Bradley Knox. MRN: [id number] Date of Birth: 1966-09-01 Referring Provider: Earney Hamburg, MD  Encounter Date: 12/10/2015      PT End of Session - 12/10/15 0946    Visit Number 1   Number of Visits 9   Date for PT Re-Evaluation 01/09/16   Authorization Type Chart states Medicaid pending, however pt states he has not applied, states may be pending Worker's comp   PT Start Time [phone]   PT Stop Time 0930   PT Time Calculation (min) 43 min   Activity Tolerance Patient limited by pain   Behavior During Therapy Flat affect      History reviewed. No pertinent past medical history.  History reviewed. No pertinent past surgical history.  There were no vitals filed for this visit.  Visit Diagnosis:  Abnormality of gait - Plan: PT plan of care cert/re-cert  Unsteadiness - Plan: PT plan of care cert/re-cert  Pain in both lower extremities - Plan: PT plan of care cert/re-cert  TBI (traumatic brain injury), with loss of consciousness of unspecified duration, initial encounter (HCC) - Plan: PT plan of care cert/re-cert  Generalized weakness - Plan: PT plan of care cert/re-cert      Subjective Assessment - 12/10/15 0853    Subjective "All I know is that I woke up in the hospital."  "I'm not able to walk like a normal person and I have really bad muscle spasms."    Limitations House hold activities;Walking;Standing   Currently in Pain? Yes   Pain Score 8    Pain Location Leg   Pain Orientation Right;Left   Pain Descriptors / Indicators Spasm   Pain Type Acute pain   Pain Radiating Towards waist to feet (ankles)   Pain Onset 1 to 4 weeks ago   Pain Frequency Constant   Aggravating Factors  if he doesn't take the meds, standing, walking   Pain Relieving Factors gets  better with meds, but doesn't go away            [facility] PT Assessment - 12/10/15 0001    Assessment   Medical Diagnosis TBI   Referring Provider Earney Hamburg, MD   Onset Date/Surgical Date 11/25/15   Hand Dominance Left   Precautions   Precautions Fall   Restrictions   Weight Bearing Restrictions No   Balance Screen   Has the patient fallen in the past 6 months No   Has the patient had a decrease in activity level because of a fear of falling?  No   Is the patient reluctant to leave their home because of a fear of falling?  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   Leisure read the Bible   Cognition   Overall Cognitive Status Impaired/Different from baseline   Area of Impairment Problem solving;Following commands   Following Commands Follows multi-step commands with increased time   Problem  Solving Slow processing;Difficulty sequencing;Requires verbal cues   Sensation   Light Touch Appears Intact   Hot/Cold Appears Intact   Proprioception Appears Intact   Coordination   Gross Motor Movements are Fluid and Coordinated Yes  LEs   Fine Motor Movements are Fluid and Coordinated Yes  LEs   Heel Shin Test WFL   ROM / Strength   AROM / PROM / Strength Strength   Strength   Overall Strength Deficits;Due to pain   Overall Strength Comments grossly WFL, however was limited at knee and ankle motions during eval due to spasms   Transfers   Transfers Sit to Stand;Stand to Sit   Sit to Stand 6: Modified independent (Device/Increase time)   Stand to Sit  6: Modified independent (Device/Increase time)   Ambulation/Gait   Ambulation/Gait Yes   Ambulation/Gait Assistance 6: Modified independent (Device/Increase time)   Ambulation Distance (Feet) 345 Feet   Assistive device None   Gait Pattern Step-through pattern;Antalgic  B LE external rotation   Ambulation Surface Level;Indoor   Gait velocity 2.81 ft/sec   Stairs Yes   Stairs Assistance 6: Modified independent (Device/Increase time)   Stair Management Technique Two rails;Alternating pattern;Backwards   Number of Stairs 4   Height of Stairs 6   Standardized Balance Assessment   Standardized Balance Assessment Timed Up and Go Test   Timed Up and Go Test   TUG Cognitive TUG   Cognitive TUG (seconds) 12.97   TUG Comments able to perform mobility in adequate time, however unable to complete cognitive task at all.    Functional Gait  Assessment   Gait assessed  Yes   Gait Level Surface Walks 20 ft, slow speed, abnormal gait pattern, evidence for imbalance or deviates 10-15 in outside of the 12 in walkway width. Requires more than 7 sec to ambulate 20 ft.   Change in Gait Speed Makes only minor adjustments to walking speed, or accomplishes a change in speed with significant gait deviations, deviates 10-15 in outside the 12 in walkway width, or changes speed but loses balance but is able to recover and continue walking.   Gait with Horizontal Head Turns Performs head turns smoothly with slight change in gait velocity (eg, minor disruption to smooth gait path), deviates 6-10 in outside 12 in walkway width, or uses an assistive device.   Gait with Vertical Head Turns Performs head turns with no change in gait. Deviates no more than 6 in outside 12 in walkway width.   Gait and Pivot Turn Pivot turns safely in greater than 3 sec and stops with no loss of balance, or pivot turns safely within 3 sec and stops with mild imbalance, requires small steps to catch balance.   Step Over Obstacle Is able to  step over one shoe box (4.5 in total height) but must slow down and adjust steps to clear box safely. May require verbal cueing.   Gait with Narrow Base of Support Is able to ambulate for 10 steps heel to toe with no staggering.   Gait with Eyes Closed Walks 20 ft, slow speed, abnormal gait pattern, evidence for imbalance, deviates 10-15 in outside 12 in walkway width. Requires more than 9 sec to ambulate 20 ft.   Ambulating Backwards Walks 20 ft, slow speed, abnormal gait pattern, evidence for imbalance, deviates 10-15 in outside 12 in walkway width.   Steps Alternating feet, must use rail.   Total Score 17   FGA comment: < 19 = high risk fall  PT Education - 12/10/15 0945    Education provided Yes   Education Details Education to notify trauma clinic of being out of medication, education regarding TBI, evaluation findings, POC   Person(s) Educated Patient   Methods Explanation   Comprehension Verbalized understanding             PT Long Term Goals - 12/10/15 0955    PT LONG TERM GOAL #1   Title Pt will be independent with HEP for balance in order to indicate decreased fall risk.  (Target Date: 01/07/16)   Baseline dependent   PT LONG TERM GOAL #2   Title Pt will increase gait speed to 3.41 ft/sec in order to indicate improved efficiency of gait.     Baseline 2.81 ft/sec 12/10/15   PT LONG TERM GOAL #3   Title Pt will improve FGA score to 22/30 in order to indicate functional improvement in balance and decreased fall risk.    Baseline 17/30 on 12/10/15   PT LONG TERM GOAL #4   Title Pt will perform dual task for up to 5 mins at mod I level in order to indicate safe return to work.     Baseline unable to perform 12/10/15   PT LONG TERM GOAL #5   Title Pt will perform cogntive TUG in <15 seconds while completing cognitive task with increased time.     Baseline unable to perform cognitive task 12/10/15               Plan -  12/10/15 0947    Clinical Impression Statement Pt presents s/p L frontal SDH and SAH after being found unresponsive at work ([employer]).  Pt with no memory of event and only remembers waking up in hospital on 11/25/15 and was discharged on 12/01/15.  He reports living at [employer] for increased S, however he does state that he has an apt that he plans to return to.  PT feels that there is concern regarding pts financial state (pt told OT that he doesn't know if he has an apt and has moved things into friends house).  PT/OT contacted IP rehab social worker for assist in case.  Pt to get set up with community resources.  Provided pt with financial aid form to fill out.  He does state that he has spoken with employer who states his insurance will kick in in Feb and will retro pay for therapies.  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  He presents with increased pain in BLEs with spasms.  Note he has started Robaxin without great relief.  Recommended he call trauma clinic to question being out of Keppra and Oxycodone.  Community resources to assist with cost if needed. He also presents with decreased balance, impaired ability to dual task and carry out simple cognitive task.  Gait speed is 2.81 ft/sec, however FGA was 17/30 indicative of fall risk and pt unable to complete cognitive TUG.  Feel that based on findings, pt appropriate for OP neuro PT to address deficits and return pt to PLOF.    Pt will benefit from skilled therapeutic intervention in order to improve on the following deficits Abnormal gait;Decreased activity tolerance;Decreased balance;Decreased cognition;Decreased endurance;Decreased knowledge of precautions;Decreased mobility;Decreased safety awareness;Decreased strength;Difficulty walking;Increased muscle spasms;Impaired perceived functional ability;Postural dysfunction;Pain   Rehab Potential Good    Clinical Impairments Affecting Rehab Potential cognitive impairments, financial restrictions  PT Frequency 2x / week  if able due to possible financial restrictions   PT Duration 4 weeks   PT Treatment/Interventions ADLs/Self Care Home Management;Moist Heat;Gait training;Stair training;Functional mobility training;Therapeutic activities;Therapeutic exercise;Balance training;Neuromuscular re-education;Cognitive remediation;Patient/family education   PT Next Visit Plan est HEP for corner balance tasks, dual task with high level balance, cognitive tasks   Recommended Other Services possibly SLP cognitive evaluation   Consulted and Agree with Plan of Care Patient         Problem List Patient Active Problem List   Diagnosis Date Noted  . Blunt head trauma 12/01/2015  . Occipital scalp laceration 12/01/2015  . TBI (traumatic brain injury) (HCC) 11/26/2015    Harriet Butte, PT, MPT [facility] [address] [place], [place], [address] Phone: [phone]   Fax:  [phone] 12/10/2015, 11:16 AM  Name: Josten Warmuth. MRN: [id number] Date of Birth: 03/03/1966

## 2015-12-12 DIAGNOSIS — R269 Unspecified abnormalities of gait and mobility: Secondary | ICD-10-CM | POA: Insufficient documentation

## 2015-12-12 DIAGNOSIS — S069X9A Unspecified intracranial injury with loss of consciousness of unspecified duration, initial encounter: Secondary | ICD-10-CM | POA: Insufficient documentation

## 2015-12-12 DIAGNOSIS — R531 Weakness: Secondary | ICD-10-CM | POA: Insufficient documentation

## 2015-12-12 DIAGNOSIS — H832X9 Labyrinthine dysfunction, unspecified ear: Secondary | ICD-10-CM | POA: Insufficient documentation

## 2015-12-12 DIAGNOSIS — X58XXXA Exposure to other specified factors, initial encounter: Secondary | ICD-10-CM | POA: Insufficient documentation

## 2015-12-12 DIAGNOSIS — R41841 Cognitive communication deficit: Secondary | ICD-10-CM | POA: Insufficient documentation

## 2015-12-12 DIAGNOSIS — R2681 Unsteadiness on feet: Secondary | ICD-10-CM | POA: Insufficient documentation

## 2015-12-20 ENCOUNTER — Ambulatory Visit: Payer: Self-pay | Admitting: Physical Therapy

## 2015-12-23 ENCOUNTER — Ambulatory Visit: Payer: Self-pay

## 2015-12-23 ENCOUNTER — Ambulatory Visit: Payer: Self-pay | Attending: Orthopedic Surgery | Admitting: Rehabilitation

## 2015-12-23 DIAGNOSIS — R41841 Cognitive communication deficit: Secondary | ICD-10-CM

## 2015-12-23 DIAGNOSIS — S069X9A Unspecified intracranial injury with loss of consciousness of unspecified duration, initial encounter: Secondary | ICD-10-CM

## 2015-12-23 NOTE — Therapy (Signed)
[facility] [address] [place], [place], [address] Phone: [phone]   Fax:  [phone]  Speech Language Pathology Evaluation  Patient Details  Name: Bradley Knox. MRN: [id number] Date of Birth: [birth year] Referring Provider: Violeta Gelinas, M.D.  Encounter Date: 12/23/2015      End of Session - 12/23/15 1703    Visit Number 1   Number of Visits 17   Date for SLP Re-Evaluation 02/21/16   SLP Start Time 1541   SLP Stop Time  1628   SLP Time Calculation (min) 47 min   Activity Tolerance Patient tolerated treatment well      No past medical history on file.  No past surgical history on file.  There were no vitals filed for this visit.  Visit Diagnosis: Cognitive communication deficit      Subjective Assessment - 12/23/15 1553    Subjective Pt would like to return to [place] to go to college for two more years, is working at [employer] now to do that.            SLP Evaluation [facility] - 12/23/15 1555    SLP Visit Information   SLP Received On 12/23/15   Referring Provider Violeta Gelinas, M.D.   Onset Date 11-24-16   Medical Diagnosis TBI   Pain Assessment   Currently in Pain? No/denies   Cognition   Area of Impairment Memory;Problem solving   Problem Solving Slow processing   Problem Solving Comments Reasoning on [place] with hotel/rope paragraph - unable to give adequate/appropriate response for SLP questions "what's he going to do with the rope?" and, "why does the clerk not trust him?"   Memory Impaired   Memory Impairment Retrieval deficit;Decreased recall of new information   Problem Solving Impaired   Problem Solving Impairment Verbal basic;Verbal complex   Auditory Comprehension   Overall Auditory Comprehension Appears within functional limits for tasks assessed   Commands Within Functional Limits  1-4 step commands 100% with min extra time on 4-step   Verbal Expression   Overall Verbal  Expression Appears within functional limits for tasks assessed   Oral Motor/Sensory Function   Overall Oral Motor/Sensory Function Appears within functional limits for tasks assessed   Motor Speech   Overall Motor Speech Appears within functional limits for tasks assessed   Standardized Assessments   Standardized Assessments  Other Assessment  Hopkins Verbal Learning   Other Assessment all WNL on recall; < WNL on recognition (11/12)     Pt recalled average 7.75 items out of 21 on the paragraph memory subtest of the [place] without compensations.                    SLP Education - 12/23/15 1703    Education provided Yes   Education Details note taking compensation for decr. memory   Person(s) Educated Patient   Methods Explanation   Comprehension Verbalized understanding          SLP Short Term Goals - 12/23/15 1710    SLP SHORT TERM GOAL #1   Title pt will tell SLP four memory strategies   Time 4   Period Weeks   Status New   SLP SHORT TERM GOAL #2   Title pt will demo a memory strategy in a session, given 4 opportunities    Time 4   Period Weeks   Status New   SLP SHORT TERM GOAL #3   Title pt will answer questions re: a 3-minute selection  with 90% success using memory strategies   Time 4   Period Weeks   Status New          SLP Long Term Goals - 12/23/15 1708    SLP LONG TERM GOAL #1   Title pt will answer questions re: a >5 minute auditory selection with 95% success using memory strategies   Time 8   Period Weeks   Status New   SLP LONG TERM GOAL #2   Title pt will report use of memory strategies outside clinic (or situations he could have used strateges) on 4 occasions   Time 8   Period Weeks   Status New          Plan - 12/23/15 1704    Clinical Impression Statement Pt presents today with min-mod language based memory deficits requiring skilled ST to teach pt compensations for memory. Suspect pt will require approx 6 weeks of  therapy.   Speech Therapy Frequency 2x / week   Duration --  8 weeks   Treatment/Interventions Cognitive reorganization;SLP instruction and feedback;Compensatory strategies;Internal/external aids;Patient/family education;Functional tasks;Cueing hierarchy   Potential to Achieve Goals Good   Consulted and Agree with Plan of Care Patient        Problem List Patient Active Problem List   Diagnosis Date Noted  . Blunt head trauma 12/01/2015  . Occipital scalp laceration 12/01/2015  . TBI (traumatic brain injury) (HCC) 11/26/2015    [facility],Kinslea Frances , MS, CCC-SLP  12/23/2015, 5:11 PM  [facility] [address] [place], [place], [address] Phone: [phone]   Fax:  [phone]  Name: Janene HarveyRay Larcom Jr. MRN: [id number] Date of Birth: 05-27-1966

## 2015-12-23 NOTE — Therapy (Signed)
[facility] [address] [place], [place], [address] Phone: [phone]   Fax:  [phone]  Physical Therapy Treatment  Patient Details  Name: Bradley HarveyRay Gayheart Jr. MRN: [id number] Date of Birth: 1966-09-24 Referring Provider: Earney HamburgMichael Jeffrey, MD  Encounter Date: 12/23/2015      PT End of Session - 12/23/15 1507    Visit Number 1   Number of Visits 9   Date for PT Re-Evaluation 01/09/16   Authorization Type Chart states Medicaid pending, however pt states he has not applied, states may be pending Worker's comp   Activity Tolerance Patient limited by pain   Behavior During Therapy Flat affect      No past medical history on file.  No past surgical history on file.  There were no vitals filed for this visit.  Visit Diagnosis:  TBI (traumatic brain injury), with loss of consciousness of unspecified duration, initial encounter (HCC)          Pt arrived to visit today, however not charged as there were many questions regarding payment, coverage for sessions, and resources previously given from Child psychotherapistsocial worker and this therapist regarding financial aid.  Front office checking of financial aid application, note that it is not showing as received, therefore provided number to call as well as other community wellness resources in order to establish care with primary care doctor.  Pt to see SLP today following discussion with insurance rep and also based on need to see SLP for higher level cognitive deficits.  Pt verbalized understanding.                            PT Long Term Goals - 12/10/15 0955    PT LONG TERM GOAL #1   Title Pt will be independent with HEP for balance in order to indicate decreased fall risk.  (Target Date: 01/07/16)   Baseline dependent   PT LONG TERM GOAL #2   Title Pt will increase gait speed to 3.41 ft/sec in order to indicate improved efficiency of gait.     Baseline 2.81  ft/sec 12/10/15   PT LONG TERM GOAL #3   Title Pt will improve FGA score to 22/30 in order to indicate functional improvement in balance and decreased fall risk.    Baseline 17/30 on 12/10/15   PT LONG TERM GOAL #4   Title Pt will perform dual task for up to 5 mins at mod I level in order to indicate safe return to work.     Baseline unable to perform 12/10/15   PT LONG TERM GOAL #5   Title Pt will perform cogntive TUG in <15 seconds while completing cognitive task with increased time.     Baseline unable to perform cognitive task 12/10/15               Problem List Patient Active Problem List   Diagnosis Date Noted  . Blunt head trauma 12/01/2015  . Occipital scalp laceration 12/01/2015  . TBI (traumatic brain injury) (HCC) 11/26/2015    Harriet ButteEmily Shelanda Duvall, PT, MPT [facility] [address] [place], [place], [address] Phone: [phone]   Fax:  [phone] 12/23/2015, 3:08 PM  Name: Bradley HarveyRay Haberman Jr. MRN: [id number] Date of Birth: 1966-09-24

## 2015-12-23 NOTE — Patient Instructions (Signed)
Take notes or jot down key points/ideas with longer verbal stimuli so they get put into longer term memory - meetings at work, sermons, Catering manageretc.

## 2015-12-24 ENCOUNTER — Encounter: Payer: Self-pay | Admitting: Rehabilitation

## 2015-12-24 ENCOUNTER — Ambulatory Visit: Payer: Self-pay | Admitting: Rehabilitation

## 2015-12-24 DIAGNOSIS — R269 Unspecified abnormalities of gait and mobility: Secondary | ICD-10-CM

## 2015-12-24 DIAGNOSIS — S069X9A Unspecified intracranial injury with loss of consciousness of unspecified duration, initial encounter: Secondary | ICD-10-CM

## 2015-12-24 DIAGNOSIS — R2681 Unsteadiness on feet: Secondary | ICD-10-CM

## 2015-12-24 DIAGNOSIS — R531 Weakness: Secondary | ICD-10-CM

## 2015-12-24 NOTE — Patient Instructions (Signed)
Gaze Stabilization: Standing Feet Apart    Feet shoulder width apart, keeping eyes on target on wall _5-6'___ feet away, place target so that head is tilted down 15-30 and move head side to side for 15 reps.  Make sure that you only turn your head as far as you can only see ONE target.  Repeat while moving head up and down for 15 reps. Do _1-2___ sessions per day.  If you are doing exercise and not feeling too bad, increase the speed of your head movements (but still only so that you see ONE target).    Copyright  VHI. All rights reserved.

## 2015-12-24 NOTE — Therapy (Signed)
[facility] [address] [place], [place], [address] Phone: [phone]   Fax:  [phone]  Physical Therapy Treatment  Patient Details  Name: Bradley Knox. MRN: [id number] Date of Birth: [birth year] Referring Provider: Earney Hamburg, MD  Encounter Date: 12/24/2015      PT End of Session - 12/24/15 0851    Visit Number 2   Number of Visits 9   Date for PT Re-Evaluation 01/09/16   Authorization Type Chart states Medicaid pending, however pt states he has not applied, states may be pending Worker's comp   PT Start Time [phone]   PT Stop Time 0930   PT Time Calculation (min) 44 min   Activity Tolerance Patient limited by pain   Behavior During Therapy Flat affect      History reviewed. No pertinent past medical history.  History reviewed. No pertinent past surgical history.  There were no vitals filed for this visit.  Visit Diagnosis:  Abnormality of gait  Unsteadiness  TBI (traumatic brain injury), with loss of consciousness of unspecified duration, initial encounter [facility])  Generalized weakness      Subjective Assessment - 12/24/15 0850    Subjective "I'm really worried about returning to work, it looks like I"m going to be with you all a little longer." "I'm seeing double and that's not good."    Limitations House hold activities;Walking;Standing   Currently in Pain? No/denies            NMR:  Worked assessment of visual deficits during vision.  Pt with stated double (side by side most of time) vision when more than 5' away from target.  Once more than 5' away, to a certain extent pt able to focus on get target back to single target, however not past 10'.  Had pt work on VOR within small range that pt can maintain single target.  Performed side to side head turns x 10 reps and up/down x 10 reps with eyes open on solid ground.  Tolerated well with only slight "funny feeling."  Remainder of session  focused on high level balance and dual tasking to simulate work duties.  Performed several sorting tasks (cards by suite) while standing on foam airdex pad for increased balance challenge (feet slightly apart)>standing on single leg sorting by number.  Note that he has some memory issues and difficulty recalling what he was sorting by as well as recalling remaining on single leg with intermittent cues throughout.    Self Care:  Discussed returning to work, concerns that PT has and the need to return when PT/SLP deem ready.  Also discussed goals of therapy and how to best challenge pt with dual task to better challenge pt cognitively for return to work.  Pt verbalized understanding.                      PT Education - 12/24/15 0851    Education provided Yes   Education Details HEP   Person(s) Educated Patient   Methods Explanation   Comprehension Verbalized understanding             PT Long Term Goals - 12/10/15 0955    PT LONG TERM GOAL #1   Title Pt will be independent with HEP for balance in order to indicate decreased fall risk.  (Target Date: 01/07/16)   Baseline dependent   PT LONG TERM GOAL #2   Title Pt will increase gait speed to 3.41 ft/sec in order  to indicate improved efficiency of gait.     Baseline 2.81 ft/sec 12/10/15   PT LONG TERM GOAL #3   Title Pt will improve FGA score to 22/30 in order to indicate functional improvement in balance and decreased fall risk.    Baseline 17/30 on 12/10/15   PT LONG TERM GOAL #4   Title Pt will perform dual task for up to 5 mins at mod I level in order to indicate safe return to work.     Baseline unable to perform 12/10/15   PT LONG TERM GOAL #5   Title Pt will perform cogntive TUG in <15 seconds while completing cognitive task with increased time.     Baseline unable to perform cognitive task 12/10/15               Plan - 12/24/15 0851    Clinical Impression Statement Skilled session began with discussion  on returning to work and concerns that PT has about returning too soon.  Pt verbalized understanding.  Note that pt states today he has been having double vision that changes from stacked double vision to sometimes side by side, esp as he is further away from target and only with smaller targets.  Pt able to see single target at about 5-6' away, therefore provided VOR in small range.  Also address high level dual tasking to better simulate work activities.     Pt will benefit from skilled therapeutic intervention in order to improve on the following deficits Abnormal gait;Decreased activity tolerance;Decreased balance;Decreased cognition;Decreased endurance;Decreased knowledge of precautions;Decreased mobility;Decreased safety awareness;Decreased strength;Difficulty walking;Increased muscle spasms;Impaired perceived functional ability;Postural dysfunction;Pain   Rehab Potential Good   Clinical Impairments Affecting Rehab Potential cognitive impairments, financial restrictions   PT Frequency 2x / week  if able due to possible financial restrictions   PT Duration 4 weeks   PT Treatment/Interventions ADLs/Self Care Home Management;Moist Heat;Gait training;Stair training;Functional mobility training;Therapeutic activities;Therapeutic exercise;Balance training;Neuromuscular re-education;Cognitive remediation;Patient/family education   PT Next Visit Plan est HEP for corner balance tasks, dual task with high level balance, cognitive tasks   Recommended Other Services may need to see OT for visual deficits?   Consulted and Agree with Plan of Care Patient        Problem List Patient Active Problem List   Diagnosis Date Noted  . Blunt head trauma 12/01/2015  . Occipital scalp laceration 12/01/2015  . TBI (traumatic brain injury) (HCC) 11/26/2015   Harriet ButteEmily Eagan Shifflett, PT, MPT [facility] [address] [place], [place], [address] Phone: [phone]   Fax:   [phone] 12/24/2015, 9:50 AM  Name: Bradley HarveyRay Licausi Jr. MRN: [id number] Date of Birth: [birth year]

## 2015-12-27 ENCOUNTER — Ambulatory Visit: Payer: Self-pay

## 2015-12-27 ENCOUNTER — Ambulatory Visit: Payer: Self-pay | Admitting: Rehabilitation

## 2015-12-27 ENCOUNTER — Encounter: Payer: Self-pay | Admitting: Rehabilitation

## 2015-12-27 DIAGNOSIS — R269 Unspecified abnormalities of gait and mobility: Secondary | ICD-10-CM

## 2015-12-27 DIAGNOSIS — R41841 Cognitive communication deficit: Secondary | ICD-10-CM

## 2015-12-27 DIAGNOSIS — R2681 Unsteadiness on feet: Secondary | ICD-10-CM

## 2015-12-27 DIAGNOSIS — H832X9 Labyrinthine dysfunction, unspecified ear: Secondary | ICD-10-CM

## 2015-12-27 DIAGNOSIS — S069X9A Unspecified intracranial injury with loss of consciousness of unspecified duration, initial encounter: Secondary | ICD-10-CM

## 2015-12-27 NOTE — Patient Instructions (Signed)
  Please complete the assigned speech therapy homework and return it to your next session.  

## 2015-12-27 NOTE — Therapy (Signed)
[facility] [address] [place], [place], [address] Phone: [phone]   Fax:  [phone]  Speech Language Pathology Treatment  Patient Details  Name: Bradley HarveyRay Vieth Jr. MRN: [id number] Date of Birth: November 12, 1966 Referring Provider: Violeta Gelinashompson, Burke, M.D.  Encounter Date: 12/27/2015      End of Session - 12/27/15 1017    Visit Number 2   Number of Visits 17   Date for SLP Re-Evaluation 02/21/16   SLP Start Time 0934   SLP Stop Time  1018   SLP Time Calculation (min) 44 min   Activity Tolerance Patient tolerated treatment well      No past medical history on file.  No past surgical history on file.  There were no vitals filed for this visit.  Visit Diagnosis: Cognitive communication deficit      Subjective Assessment - 12/27/15 0951    Subjective Pt spoke with SLP about football game last night.   Currently in Pain? No/denies               ADULT SLP TREATMENT - 12/27/15 0952    General Information   Behavior/Cognition Alert;Cooperative;Pleasant mood   Treatment Provided   Treatment provided Cognitive-Linquistic   Cognitive-Linquistic Treatment   Treatment focused on Cognition   Skilled Treatment Pt provided with detailed cognitive-linguistic task with good success, but only after initial SLP mod A for reading the stimulus correctly. SLP educated pt re: memory strategies and he recalled 3/4 after 5 minutes. (memory picture for mental picture).   Assessment / Recommendations / Plan   Plan Continue with current plan of care   Progression Toward Goals   Progression toward goals Progressing toward goals          SLP Education - 12/27/15 1017    Education provided Yes   Education Details memory strategies   Person(s) Educated Patient   Methods Explanation;Demonstration   Comprehension Verbalized understanding;Need further instruction          SLP Short Term Goals - 12/27/15 1005    SLP  SHORT TERM GOAL #1   Title pt will tell SLP four memory strategies   Time 4   Period Weeks   Status On-going   SLP SHORT TERM GOAL #2   Title pt will demo a memory strategy in a session, given 4 opportunities    Time 4   Period Weeks   Status On-going   SLP SHORT TERM GOAL #3   Title pt will answer questions re: a 3-minute selection with 90% success using memory strategies   Time 4   Period Weeks   Status On-going          SLP Long Term Goals - 12/27/15 1033    SLP LONG TERM GOAL #1   Title pt will answer questions re: a >5 minute auditory selection with 95% success using memory strategies   Time 8   Period Weeks   Status On-going   SLP LONG TERM GOAL #2   Title pt will report use of memory strategies outside clinic (or situations he could have used strateges) on 4 occasions   Time 8   Period Weeks   Status On-going          Plan - 12/27/15 1017    Clinical Impression Statement Pt would cont to benefit from skilled ST to maximize cognitive linguistic deficits for possible return to work.   Speech Therapy Frequency 2x / week   Duration --  8 weeks  Treatment/Interventions Cognitive reorganization;SLP instruction and feedback;Compensatory strategies;Internal/external aids;Patient/family education;Functional tasks;Cueing hierarchy   Potential to Achieve Goals Good        Problem List Patient Active Problem List   Diagnosis Date Noted  . Blunt head trauma 12/01/2015  . Occipital scalp laceration 12/01/2015  . TBI (traumatic brain injury) (HCC) 11/26/2015    [facility] , MS, CCC-SLP 12/27/2015, 10:34 AM  [facility] [address] [place], [place], [address] Phone: [phone]   Fax:  [phone]   Name: Bradley Knox. MRN: [id number] Date of Birth: [birth year]

## 2015-12-27 NOTE — Patient Instructions (Addendum)
Eye exercise:  Use your "A" target that you already have.  Cover one eye and move the target in the shape of an "H" in front of face (at arms length away).  Perform x 3 reps and then switch and cover the other eye and repeat the "H" x 3 reps.  Do this 2-3 times per day.    Feet Apart (Compliant Surface) Head Motion - Eyes Open    Stand in a corner with a chair in front of you.  With eyes open, standing on compliant surface: _Stacked pillows_______, feet shoulder width apart, move head slowly: up and down x 15 reps.  Repeat turning head side to side x 15 reps.   Keep an eye on your dizziness.  If you start at 0/10 dizziness, do not increase more than 2-3 points from baseline.  Otherwise, stop and rest.   Repeat __1__ times per session. Do __2__ sessions per day.  Copyright  VHI. All rights reserved.

## 2015-12-27 NOTE — Therapy (Signed)
[facility] [address] [place], [place], [address] Phone: [phone]   Fax:  [phone]  Physical Therapy Treatment  Patient Details  Name: Bradley Knox. MRN: [id number] Date of Birth: 01-08-1966 Referring Provider: Earney Hamburg, MD  Encounter Date: 12/27/2015      PT End of Session - 12/27/15 0813    Visit Number 3   Number of Visits 9   Date for PT Re-Evaluation 01/09/16   Authorization Type Chart states Medicaid pending, however pt states he has not applied, states may be pending Worker's comp   PT Start Time 0815  pt late to appt   PT Stop Time 0846   PT Time Calculation (min) 31 min   Activity Tolerance Patient tolerated treatment well   Behavior During Therapy Flat affect      History reviewed. No pertinent past medical history.  History reviewed. No pertinent past surgical history.  There were no vitals filed for this visit.  Visit Diagnosis:  Abnormality of gait  Unsteadiness  TBI (traumatic brain injury), with loss of consciousness of unspecified duration, initial encounter [facility])  Vestibular dysfunction, unspecified laterality      Subjective Assessment - 12/27/15 0813    Subjective "I did a lot of walking over the weekend and I got dizzy."     Limitations House hold activities;Walking;Standing   Currently in Pain? No/denies          Self Care: Discussed visual deficits and that they have been getting worse since initial evaluation, therefore recommended pt call trauma MD and attempt to make an appt to assess visual deficits more formally.  Pt verbalized understanding.  NMR:  Also provided pt with occulomotor exercise to work on strengthening eye musculature to decrease double vision.  See pt instruction for details.  Assessed SOT to further address balance systems and whether pt has visual vestibular deficits vs pure visual deficits.  Note composite score of 63 out of 4 (age  matched normal score) with decreased ability to utilize vestibular and visual systems to react for balance (did poorly in conditions 4-6).  Note that he does have good activation of ankle and hip strategy appropriately and keeps COG mostly WFL.  Added single vestibular exercise to HEP, however did not get to actually perform due to time constraints, but will address in more detail on next visit.  Educated on meaning of SOT and purpose of performing.  Pt verbalized understanding.                      PT Education - 12/27/15 0813    Education provided Yes   Education Details Additions to HEP, SOT testing   Person(s) Educated Patient   Methods Explanation;Handout   Comprehension Verbalized understanding             PT Long Term Goals - 12/10/15 0955    PT LONG TERM GOAL #1   Title Pt will be independent with HEP for balance in order to indicate decreased fall risk.  (Target Date: 01/07/16)   Baseline dependent   PT LONG TERM GOAL #2   Title Pt will increase gait speed to 3.41 ft/sec in order to indicate improved efficiency of gait.     Baseline 2.81 ft/sec 12/10/15   PT LONG TERM GOAL #3   Title Pt will improve FGA score to 22/30 in order to indicate functional improvement in balance and decreased fall risk.    Baseline 17/30 on 12/10/15  PT LONG TERM GOAL #4   Title Pt will perform dual task for up to 5 mins at mod I level in order to indicate safe return to work.     Baseline unable to perform 12/10/15   PT LONG TERM GOAL #5   Title Pt will perform cogntive TUG in <15 seconds while completing cognitive task with increased time.     Baseline unable to perform cognitive task 12/10/15               Plan - 12/27/15 0813    Clinical Impression Statement Skilled session focused on providing pt with occulomotor exercise to increase ROM and decrease double vision.  Educated pt that if he feels double vision has worsened that he needs to contact trauma clinic for  appt and assessment of new symptoms.  Pt verbalized understanding.  Also assessed possible visual vestibular deficits with SOT.  Note pt is limited when vestibular system most challenged.     Pt will benefit from skilled therapeutic intervention in order to improve on the following deficits Abnormal gait;Decreased activity tolerance;Decreased balance;Decreased cognition;Decreased endurance;Decreased knowledge of precautions;Decreased mobility;Decreased safety awareness;Decreased strength;Difficulty walking;Increased muscle spasms;Impaired perceived functional ability;Postural dysfunction;Pain   Rehab Potential Good   Clinical Impairments Affecting Rehab Potential cognitive impairments, financial restrictions   PT Frequency 2x / week  if able due to possible financial restrictions   PT Duration 4 weeks   PT Treatment/Interventions ADLs/Self Care Home Management;Moist Heat;Gait training;Stair training;Functional mobility training;Therapeutic activities;Therapeutic exercise;Balance training;Neuromuscular re-education;Cognitive remediation;Patient/family education   PT Next Visit Plan est HEP for corner balance tasks (esp for vestibular deficits)-go over exercise given, dual task with high level balance, cognitive tasks   Consulted and Agree with Plan of Care Patient        Problem List Patient Active Problem List   Diagnosis Date Noted  . Blunt head trauma 12/01/2015  . Occipital scalp laceration 12/01/2015  . TBI (traumatic brain injury) (HCC) 11/26/2015    Harriet ButteEmily Hartford Maulden, PT, MPT [facility] [address] [place], [place], [address] Phone: [phone]   Fax:  [phone] 12/27/2015, 9:54 AM  Name: Bradley HarveyRay Susan Jr. MRN: [id number] Date of Birth: [birth year]

## 2015-12-29 ENCOUNTER — Ambulatory Visit: Payer: Self-pay | Admitting: Rehabilitation

## 2015-12-31 ENCOUNTER — Ambulatory Visit: Payer: Self-pay

## 2015-12-31 ENCOUNTER — Ambulatory Visit: Payer: Self-pay | Admitting: Rehabilitation

## 2015-12-31 ENCOUNTER — Encounter: Payer: Self-pay | Admitting: Rehabilitation

## 2015-12-31 DIAGNOSIS — H832X9 Labyrinthine dysfunction, unspecified ear: Secondary | ICD-10-CM

## 2015-12-31 DIAGNOSIS — S069X9A Unspecified intracranial injury with loss of consciousness of unspecified duration, initial encounter: Secondary | ICD-10-CM

## 2015-12-31 DIAGNOSIS — R531 Weakness: Secondary | ICD-10-CM

## 2015-12-31 DIAGNOSIS — R41841 Cognitive communication deficit: Secondary | ICD-10-CM

## 2015-12-31 DIAGNOSIS — R269 Unspecified abnormalities of gait and mobility: Secondary | ICD-10-CM

## 2015-12-31 DIAGNOSIS — R2681 Unsteadiness on feet: Secondary | ICD-10-CM

## 2015-12-31 NOTE — Therapy (Signed)
[facility] [address] [place], [place], [address] Phone: [phone]   Fax:  [phone]  Physical Therapy Treatment  Patient Details  Name: Bradley Knox. MRN: [id number] Date of Birth: Sep 09, 1966 Referring Provider: Earney Hamburg, MD  Encounter Date: 12/31/2015      PT End of Session - 12/31/15 0933    Visit Number 4   Number of Visits 9   Date for PT Re-Evaluation 01/09/16   Authorization Type Chart states Medicaid pending, however pt states he has not applied, states may be pending Worker's comp   Activity Tolerance Patient tolerated treatment well   Behavior During Therapy Flat affect      History reviewed. No pertinent past medical history.  History reviewed. No pertinent past surgical history.  There were no vitals filed for this visit.  Visit Diagnosis:  No diagnosis found.      Subjective Assessment - 12/31/15 0932    Subjective "I did some reading yesterday and at one point, it all went double."    Limitations House hold activities;Walking;Standing   Currently in Pain? No/denies           NMR:  Addressed vestibular and visual deficits from previous sessions SOT with corner balance tasks.  Initiated session with standing on compliant surface, feet apart with EC x 30 secs and no unsteadiness noted, therefore increased challenge to standing with feet apart, EC and performing head turns side to side x 10 reps and up and down x 10 reps.  Note that with vertical head turns, dizziness is more easily provoked to 6/10, therefore when added to HEP, decreased frequency and provided cues to only increase to 2-3 points above baseline.  Continued with corner balance tasks with feet together on pillows, EC with slower head turns side to side x 10 reps and up and down x 5 reps (again due to increased dizziness).  See pt instruction for full details on exercises provided.  Also performed visual targeting side  to side while in corner on stacked pillows.  Pt able to perform at fairly fast rate and keep in single vision, therefore did not provide for HEP.  Ended session with high level reaching task from floor to elevated table in diagonal pattern x 20 reps progressing to moving in alternating diagonal pattern from table to opposite side of floor x 20 reps.  Pt with marked increase in fatigue, however no dizziness when performing from floor to table but dizziness to 3/10 with table to floor task.                        PT Education - 12/31/15 0933    Education provided Yes   Person(s) Educated Patient   Methods Explanation   Comprehension Verbalized understanding             PT Long Term Goals - 12/10/15 0955    PT LONG TERM GOAL #1   Title Pt will be independent with HEP for balance in order to indicate decreased fall risk.  (Target Date: 01/07/16)   Baseline dependent   PT LONG TERM GOAL #2   Title Pt will increase gait speed to 3.41 ft/sec in order to indicate improved efficiency of gait.     Baseline 2.81 ft/sec 12/10/15   PT LONG TERM GOAL #3   Title Pt will improve FGA score to 22/30 in order to indicate functional improvement in balance and decreased fall risk.  Baseline 17/30 on 12/10/15   PT LONG TERM GOAL #4   Title Pt will perform dual task for up to 5 mins at mod I level in order to indicate safe return to work.     Baseline unable to perform 12/10/15   PT LONG TERM GOAL #5   Title Pt will perform cogntive TUG in <15 seconds while completing cognitive task with increased time.     Baseline unable to perform cognitive task 12/10/15               Plan - 12/31/15 0934    Pt will benefit from skilled therapeutic intervention in order to improve on the following deficits Abnormal gait;Decreased activity tolerance;Decreased balance;Decreased cognition;Decreased endurance;Decreased knowledge of precautions;Decreased mobility;Decreased safety  awareness;Decreased strength;Difficulty walking;Increased muscle spasms;Impaired perceived functional ability;Postural dysfunction;Pain   Rehab Potential Good   Clinical Impairments Affecting Rehab Potential cognitive impairments, financial restrictions   PT Frequency 2x / week  if able due to possible financial restrictions   PT Duration 4 weeks   PT Treatment/Interventions ADLs/Self Care Home Management;Moist Heat;Gait training;Stair training;Functional mobility training;Therapeutic activities;Therapeutic exercise;Balance training;Neuromuscular re-education;Cognitive remediation;Patient/family education   PT Next Visit Plan est HEP for corner balance tasks (esp for vestibular deficits)-go over exercise given, dual task with high level balance, cognitive tasks , LOOK AT CONVERGENCE, HEAD SHAKING NYSTAGMUS TEST w/ frenzels   Consulted and Agree with Plan of Care Patient        Problem List Patient Active Problem List   Diagnosis Date Noted  . Blunt head trauma 12/01/2015  . Occipital scalp laceration 12/01/2015  . TBI (traumatic brain injury) (HCC) 11/26/2015    Harriet Butte, PT, MPT [facility] [address] [place], [place], [address] Phone: [phone]   Fax:  [phone] 12/31/2015, 2:27 PM  Name: Bradley Knox. MRN: [id number] Date of Birth: 09-11-1966

## 2015-12-31 NOTE — Therapy (Signed)
[facility] [address] [place], [place], [address] Phone: [phone]   Fax:  [phone]  Speech Language Pathology Treatment  Patient Details  Name: Bradley Knox. MRN: [id number] Date of Birth: March 29, 1966 Referring Provider: Violeta Gelinas, M.D.  Encounter Date: 12/31/2015      End of Session - 12/31/15 1312    Visit Number 3   Number of Visits 17   Date for SLP Re-Evaluation 02/21/16   SLP Start Time 0846   SLP Stop Time  0930   SLP Time Calculation (min) 44 min   Activity Tolerance Patient tolerated treatment well      No past medical history on file.  No past surgical history on file.  There were no vitals filed for this visit.  Visit Diagnosis: Cognitive communication deficit      Subjective Assessment - 12/31/15 0911    Subjective Pt had episode of vertigo - subsided with compensations from PT.               ADULT SLP TREATMENT - 12/31/15 0852    General Information   Behavior/Cognition Alert;Cooperative;Pleasant mood   Treatment Provided   Treatment provided Cognitive-Linquistic   Pain Assessment   Pain Assessment No/denies pain   Cognitive-Linquistic Treatment   Treatment focused on Cognition   Skilled Treatment Gave pt organizing your day activity. Pt left out 3 tasks, and perseverated on having to be done with everything by 4pm. SLP attempted to reason with pt re: why not everything had to be done by 4pm. Pt cont perseveration.    Assessment / Recommendations / Plan   Plan Continue with current plan of care   Progression Toward Goals   Progression toward goals Progressing toward goals            SLP Short Term Goals - 12/31/15 0900    SLP SHORT TERM GOAL #1   Title pt will tell SLP four memory strategies   Time 4   Period Weeks   Status On-going   SLP SHORT TERM GOAL #2   Title pt will demo a memory strategy in a session, given 4 opportunities    Time 4   Period  Weeks   Status On-going   SLP SHORT TERM GOAL #3   Title pt will answer questions re: a 3-minute selection with 90% success using memory strategies   Time 4   Period Weeks   Status On-going          SLP Long Term Goals - 12/31/15 0901    SLP LONG TERM GOAL #1   Title pt will answer questions re: a >5 minute auditory selection with 95% success using memory strategies   Time 8   Period Weeks   Status On-going   SLP LONG TERM GOAL #2   Title pt will report use of memory strategies outside clinic (or situations he could have used strateges) on 4 occasions   Time 8   Period Weeks   Status On-going          Plan - 12/31/15 1312    Clinical Impression Statement Pt would cont to benefit from skilled ST to maximize cognitive linguistic deficits for possible return to work.   Speech Therapy Frequency 2x / week   Duration --  8 weeks   Treatment/Interventions Cognitive reorganization;SLP instruction and feedback;Compensatory strategies;Internal/external aids;Patient/family education;Functional tasks;Cueing hierarchy   Potential to Achieve Goals Good        Problem List Patient Active Problem  List   Diagnosis Date Noted  . Blunt head trauma 12/01/2015  . Occipital scalp laceration 12/01/2015  . TBI (traumatic brain injury) (HCC) 11/26/2015    [facility] , MS, CCC-SLP  12/31/2015, 1:14 PM  [facility] [address] [place], [place], [address] Phone: [phone]   Fax:  [phone]   Name: Bradley Knox. MRN: [id number] Date of Birth: Jul 16, 1966

## 2015-12-31 NOTE — Patient Instructions (Addendum)
Keep doing the exercises that you are doing at home (looking at the "A" and moving your head side to side and up and down) but add standing on pillow with feet together for increased balance challenge.  Remember, only do within the range that you see one single "A."  Stop and rest if your dizziness increases or stop and re-focus if you begin to see double.      Feet Apart (Compliant Surface) Head Motion - Eyes Closed    Stand in a corner, chair in front of you for support. Stand on compliant surface: __Stacked pillows ______ with feet shoulder width apart. Close eyes and move head slowly side to side (you can probably turn a little faster with your feet apart) x 10 reps.  Repeat up and down but only do 5 reps up and down.  Only increase your dizziness by 3 points from where you started! Repeat __2__ times per session. Do __1-2__ sessions per day.  Copyright  VHI. All rights reserved.   Feet Together (Compliant Surface) Varied Arm Positions - Eyes Closed    Stand in corner with chair in front of you.  Stand on compliant surface: __Stacked pillows______ with feet together and arms by your side. Close eyes and visualize upright position. Hold__30__ seconds. Repeat __3__ times per session. Do _1-2___ sessions per day.  Copyright  VHI. All rights reserved.   Feet Together (Compliant Surface) Head Motion - Eyes Closed    Stand in a corner with chair in front of you.  Stand on compliant surface: __Stacked pillows_____ with feet together. Close eyes and move head slowly side to side x 10 reps slowly!!.  Repeat moving head up and down only for 5 reps and go slow!!.  Again, don't increase dizziness more than 3 points!! Repeat __2__ times per session. Do _1-2___ sessions per day.  Copyright  VHI. All rights reserved.

## 2015-12-31 NOTE — Patient Instructions (Signed)
  Please complete the assigned speech therapy homework and return it to your next session.  

## 2016-01-03 ENCOUNTER — Ambulatory Visit: Payer: Self-pay | Admitting: Rehabilitation

## 2016-01-03 ENCOUNTER — Encounter: Payer: Self-pay | Admitting: Rehabilitation

## 2016-01-03 DIAGNOSIS — S069X9A Unspecified intracranial injury with loss of consciousness of unspecified duration, initial encounter: Secondary | ICD-10-CM

## 2016-01-03 DIAGNOSIS — R2681 Unsteadiness on feet: Secondary | ICD-10-CM

## 2016-01-03 DIAGNOSIS — R269 Unspecified abnormalities of gait and mobility: Secondary | ICD-10-CM

## 2016-01-03 DIAGNOSIS — R531 Weakness: Secondary | ICD-10-CM

## 2016-01-03 DIAGNOSIS — H832X9 Labyrinthine dysfunction, unspecified ear: Secondary | ICD-10-CM

## 2016-01-03 NOTE — Therapy (Signed)
Bradley Knox [address] [place], [place], [address] Phone: [phone]   Fax:  [phone]  Physical Therapy Treatment  Patient Details  Name: Bradley Knox. MRN: [id number] Date of Birth: 1966/04/20 Referring Provider: Earney Hamburg, MD  Encounter Date: 01/03/2016      PT End of Session - 01/03/16 0850    Visit Number 5   Number of Visits 9   Date for PT Re-Evaluation 01/09/16   Authorization Type Waiting for [employer] insurance to kick in   PT Start Time [phone]   PT Stop Time 0930   PT Time Calculation (min) 43 min   Activity Tolerance Patient tolerated treatment well   Behavior During Therapy [facility] for tasks assessed/performed      History reviewed. No pertinent past medical history.  History reviewed. No pertinent past surgical history.  There were no vitals filed for this visit.  Visit Diagnosis:  Abnormality of gait  Unsteadiness  Generalized weakness  TBI (traumatic brain injury), with loss of consciousness of unspecified duration, initial encounter [facility])  Vestibular dysfunction, unspecified laterality      Subjective Assessment - 01/03/16 0849    Subjective "I thought I was supposed to come see you today."    Limitations House hold activities;Walking;Standing   Currently in Pain? No/denies             NMR:  Reviewed HEP for corner balance and vestibular tasks.  Pt requires use of handout and min cues to perform, but continues to demonstrate increased dizziness with vertical head turns. Progressed to high level balance tasks in // bars.  Performed standing on small rocker board to elicit better ankle and hip strategy while improving vestibular system; maintaining balance with EO x 30 secs, EC x 30 secs, EO w/ head turns side/side x 10 reps and EO w/ head turns up/down x 5 reps (to avoid overt dizziness).  Tolerated well, however when added EC and head turns (same reps as above) requires heavy  cues and facilitation for weight shifts with ankles/hips, esp with head turns up and down.  Ended in // bars while standing on foam balance beam with feet in tandem (alternating) x 30 sec w/ EO progressing to EC.  Again, note increased challenge with EC and has decreased ability to utilize body reactions rather than reaching for bars with UEs.  Ended session with task in balance master working on improving vestibular input with responsive floor tapping to targets called out by therapist.  Note activity seemed somewhat easy, therefore increased challenge by making floor responsive and surround responsive.  Note more difficulty and pt with great anterior/posterior translation, but only had single overt LOB needing min/mod A to correct.  Performed in this manner reaching for targets called by therapist (single letter)>double letter>to increasing dual task challenge by having pt make words with present letters.  Note increased difficulty making words and needs mod cues from therapist at times.                      PT Education - 01/03/16 0850    Education provided Yes   Education Details continued compliance with  HEP   Person(s) Educated Patient   Methods Explanation   Comprehension Verbalized understanding             PT Long Term Goals - 12/10/15 0955    PT LONG TERM GOAL #1   Title Pt will be independent with HEP for balance in  order to indicate decreased fall risk.  (Target Date: 01/07/16)   Baseline dependent   PT LONG TERM GOAL #2   Title Pt will increase gait speed to 3.41 ft/sec in order to indicate improved efficiency of gait.     Baseline 2.81 ft/sec 12/10/15   PT LONG TERM GOAL #3   Title Pt will improve FGA score to 22/30 in order to indicate functional improvement in balance and decreased fall risk.    Baseline 17/30 on 12/10/15   PT LONG TERM GOAL #4   Title Pt will perform dual task for up to 5 mins at mod I level in order to indicate safe return to work.      Baseline unable to perform 12/10/15   PT LONG TERM GOAL #5   Title Pt will perform cogntive TUG in <15 seconds while completing cognitive task with increased time.     Baseline unable to perform cognitive task 12/10/15               Plan - 01/03/16 0850    Clinical Impression Statement Skilled session focused on continuing high level balance and vestibular challenge with rocker board and balance master activities.  Tolerated well with only mild increase in dizziness.     Pt will benefit from skilled therapeutic intervention in order to improve on the following deficits Abnormal gait;Decreased activity tolerance;Decreased balance;Decreased cognition;Decreased endurance;Decreased knowledge of precautions;Decreased mobility;Decreased safety awareness;Decreased strength;Difficulty walking;Increased muscle spasms;Impaired perceived functional ability;Postural dysfunction;Pain   Rehab Potential Good   Clinical Impairments Affecting Rehab Potential cognitive impairments, financial restrictions   PT Frequency 2x / week  if able due to possible financial restrictions   PT Duration 4 weeks   PT Treatment/Interventions ADLs/Self Care Home Management;Moist Heat;Gait training;Stair training;Functional mobility training;Therapeutic activities;Therapeutic exercise;Balance training;Neuromuscular re-education;Cognitive remediation;Patient/family education   PT Next Visit Plan assess compliance/performance HEP for corner balance tasks (esp for vestibular deficits), dual task with high level balance, cognitive tasks   Consulted and Agree with Plan of Care Patient        Problem List Patient Active Problem List   Diagnosis Date Noted  . Blunt head trauma 12/01/2015  . Occipital scalp laceration 12/01/2015  . TBI (traumatic brain injury) (HCC) 11/26/2015    Bradley Knox, PT, MPT [facility] [address] [place], [place], [address] Phone: [phone]    Fax:  [phone] 01/03/2016, 10:37 AM  Name: Bradley Knox. MRN: [id number] Date of Birth: 10/02/1966

## 2016-01-04 ENCOUNTER — Ambulatory Visit: Payer: Self-pay | Admitting: Speech Pathology

## 2016-01-04 ENCOUNTER — Ambulatory Visit: Payer: Self-pay | Admitting: Rehabilitation

## 2016-01-04 DIAGNOSIS — R41841 Cognitive communication deficit: Secondary | ICD-10-CM

## 2016-01-04 NOTE — Therapy (Signed)
[facility] [address] [place], [place], [address] Phone: [phone]   Fax:  [phone]  Speech Language Pathology Treatment  Patient Details  Name: Bradley Knox. MRN: [id number] Date of Birth: 1966-11-08 Referring Provider: Violeta Gelinas, M.D.  Encounter Date: 01/04/2016      End of Session - 01/04/16 1154    Visit Number 4   Number of Visits 17   SLP Start Time 1017   SLP Stop Time  1100   SLP Time Calculation (min) 43 min      No past medical history on file.  No past surgical history on file.  There were no vitals filed for this visit.  Visit Diagnosis: Cognitive communication deficit      Subjective Assessment - 01/04/16 1022    Subjective "I had a hard time with the homework"   Currently in Pain? No/denies               ADULT SLP TREATMENT - 01/04/16 1023    General Information   Behavior/Cognition Alert;Cooperative;Pleasant mood   Treatment Provided   Treatment provided Cognitive-Linquistic   Pain Assessment   Pain Assessment No/denies pain   Cognitive-Linquistic Treatment   Treatment focused on Cognition   Skilled Treatment Pt had not completed homework - Mildly to moderately complex reasoning tasks, with alternating attention, completed in this session with usual mod A. Pt verbalized 2 memory strategies, denies missing meds or appointments. Recall details after studying detailed picture with 80% accuracy and occasional min verbal cues. Trained pt in cognitive memory acitivies to do at home.    Assessment / Recommendations / Plan   Plan Continue with current plan of care          SLP Education - 01/04/16 1152    Education provided Yes   Education Details Memory compensations   Person(s) Educated Patient   Methods Explanation;Handout   Comprehension Verbalized understanding;Need further instruction          SLP Short Term Goals - 01/04/16 1153    SLP SHORT TERM GOAL  #1   Title pt will tell SLP four memory strategies   Time 3   Period Weeks   Status On-going   SLP SHORT TERM GOAL #2   Title pt will demo a memory strategy in a session, given 4 opportunities    Time 3   Period Weeks   Status On-going   SLP SHORT TERM GOAL #3   Title pt will answer questions re: a 3-minute selection with 90% success using memory strategies   Time 3   Period Weeks   Status On-going          SLP Long Term Goals - 01/04/16 1154    SLP LONG TERM GOAL #1   Title pt will answer questions re: a >5 minute auditory selection with 95% success using memory strategies   Time 7   Period Weeks   Status On-going   SLP LONG TERM GOAL #2   Title pt will report use of memory strategies outside clinic (or situations he could have used strateges) on 4 occasions   Time 7   Period Weeks   Status On-going          Plan - 01/04/16 1153    Clinical Impression Statement Pt would cont to benefit from skilled ST to maximize cognitive linguistic deficits for possible return to work.   Speech Therapy Frequency 2x / week   Duration 2 weeks   Treatment/Interventions  Cognitive reorganization;SLP instruction and feedback;Compensatory strategies;Internal/external aids;Patient/family education;Functional tasks;Cueing hierarchy   Potential to Achieve Goals Good   Consulted and Agree with Plan of Care Patient        Problem List Patient Active Problem List   Diagnosis Date Noted  . Blunt head trauma 12/01/2015  . Occipital scalp laceration 12/01/2015  . TBI (traumatic brain injury) (HCC) 11/26/2015    Rick Carruthers, Radene Journey MS, CCC-SLP 01/04/2016, 11:54 AM  [facility] [address] [place], [place], [address] Phone: [phone]   Fax:  [phone]   Name: Bradley Lammert. MRN: [id number] Date of Birth: 1966-05-22

## 2016-01-04 NOTE — Patient Instructions (Signed)
   Memory Strategies  W - Write it down  A - Associate it with something  R - Repeat it  M - Mental Image     Play the memory game - app  Try to remember 3-5 items on your store list without looking  Study a detailed picture in a magazine for 1 minute, then write down everything you can remember from the picture  Chess, checkers, connect 4, UNO, board games, card games,

## 2016-01-05 ENCOUNTER — Ambulatory Visit: Payer: Self-pay | Admitting: Rehabilitation

## 2016-01-07 ENCOUNTER — Encounter: Payer: Self-pay | Admitting: Rehabilitation

## 2016-01-07 ENCOUNTER — Ambulatory Visit: Payer: Self-pay | Admitting: Rehabilitation

## 2016-01-07 ENCOUNTER — Ambulatory Visit: Payer: Self-pay

## 2016-01-07 DIAGNOSIS — R531 Weakness: Secondary | ICD-10-CM

## 2016-01-07 DIAGNOSIS — R269 Unspecified abnormalities of gait and mobility: Secondary | ICD-10-CM

## 2016-01-07 DIAGNOSIS — R2681 Unsteadiness on feet: Secondary | ICD-10-CM

## 2016-01-07 DIAGNOSIS — R41841 Cognitive communication deficit: Secondary | ICD-10-CM

## 2016-01-07 DIAGNOSIS — H832X9 Labyrinthine dysfunction, unspecified ear: Secondary | ICD-10-CM

## 2016-01-07 NOTE — Patient Instructions (Signed)
Play memory game/s and brain games. Consider downloading Sudoku as well. Use your phone for memory!! Write things down in it.

## 2016-01-07 NOTE — Patient Instructions (Addendum)
     Keep doing the exercises that you are doing at home (looking at the "A" and moving your head side to side and up and down) but add standing on pillow with feet together for increased balance challenge. Remember, only do within the range that you see one single "A." Stop and rest if your dizziness increases or stop and re-focus if you begin to see double.     Feet Apart (Compliant Surface) Head Motion - Eyes Closed    Stand in a corner, chair in front of you for support. Stand on compliant surface: __Stacked pillows ______ with feet shoulder width apart. Close eyes and move head slowly side to side (you can probably turn a little faster with your feet apart) x 10 reps. Repeat up and down but only do 5 reps up and down. Only increase your dizziness by 3 points from where you started! Repeat __2__ times per session. Do __1-2__ sessions per day.  Copyright  VHI. All rights reserved.   Feet Together (Compliant Surface) Varied Arm Positions - Eyes Closed    Stand in corner with chair in front of you. Stand on compliant surface: __Stacked pillows______ with feet together and arms by your side. Close eyes and visualize upright position. Hold__30__ seconds.  Start with this one first, don't add head turns until the next one!!!! Repeat __3__ times per session. Do _1-2___ sessions per day.  Copyright  VHI. All rights reserved.   Feet Together (Compliant Surface) Head Motion - Eyes Closed    Stand in a corner with chair in front of you. Stand on compliant surface: __Stacked pillows_____ with feet together. Close eyes and move head slowly side to side x 10 reps slowly!!. Repeat moving head up and down only for 5 reps and go slow!!. Again, don't increase dizziness more than 3 points!! You can increase up/down movements when you feel that dizziness has decreased.   Repeat __2__ times per session. Do _1-2___ sessions per day.  Copyright  VHI. All rights reserved.          Gaze Stabilization: Standing Feet Apart    Feet shoulder width apart, keeping eyes on target on wall _5-6'_(as long as you only see one)__ feet away, place target so that head is tilted down 15-30 and move head side to side for 15 reps. Make sure that you only turn your head as far as you can only see ONE target. Repeat while moving head up and down for 15 reps.   Do _1-2___ sessions per day.  If you are doing exercise and not feeling too bad, increase the speed of your head movements (but still only so that you see ONE target).

## 2016-01-07 NOTE — Therapy (Signed)
[facility] [address] [place], [place], [address] Phone: [phone]   Fax:  [phone]  Speech Language Pathology Treatment  Patient Details  Name: Bradley Knox. MRN: [id number] Date of Birth: 04/29/1966 Referring Provider: Violeta Gelinas, M.D.  Encounter Date: 01/07/2016      End of Session - 01/07/16 0941    Visit Number 5   Number of Visits 17   Date for SLP Re-Evaluation 02/21/16   SLP Start Time 0850   SLP Stop Time  0931   SLP Time Calculation (min) 41 min   Activity Tolerance Patient tolerated treatment well      No past medical history on file.  No past surgical history on file.  There were no vitals filed for this visit.  Visit Diagnosis: Cognitive communication deficit             ADULT SLP TREATMENT - 01/07/16 0856    General Information   Behavior/Cognition Alert;Cooperative;Pleasant mood   Treatment Provided   Treatment provided Cognitive-Linquistic   Pain Assessment   Pain Assessment No/denies pain   Cognitive-Linquistic Treatment   Treatment focused on Cognition   Skilled Treatment Pt recalled 2/4 strategies. SLP educated pt on where he could write things down and he stated his phone. In the midst of this task pt told SLP he came to clinic on Monday instead of the following day this week due to misinterpreting his therapy schedule. Pt's homework was not completed  - put a memory app on his phone. Because of this, SLP and pt worked at putting a Tree surgeon or two on his phone. Pt req'd A with spelling, simple- stated his spelling was not good premorbidly as well. 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 Pt demonstrated good reasoning about which apps to and not to download.    Assessment / Recommendations / Plan   Plan Continue with current plan of care   Progression Toward Goals   Progression toward goals Progressing toward goals           SLP Education - 01/07/16 0940    Education provided Yes   Education Details memory apps, compensations for memory (notes in phone), looking at schedule before getting on bus to come to tx to ensure correct day   Person(s) Educated Patient   Methods Explanation   Comprehension Verbalized understanding          SLP Short Term Goals - 01/07/16 0855    SLP SHORT TERM GOAL #1   Title pt will tell SLP four memory strategies   Time 3   Period Weeks   Status On-going   SLP SHORT TERM GOAL #2   Title pt will demo a memory strategy in a session, given 4 opportunities    Time 3   Period Weeks   Status On-going   SLP SHORT TERM GOAL #3   Title pt will answer questions re: a 3-minute selection with 90% success using memory strategies   Time 3   Period Weeks   Status On-going          SLP Long Term Goals - 01/07/16 2536    SLP LONG TERM GOAL #1   Title pt will answer questions re: a >5 minute auditory selection with 95% success using memory strategies   Time 7   Period Weeks   Status On-going   SLP LONG TERM GOAL #2   Title pt will report use of memory strategies outside  clinic (or situations he could have used strateges) on 4 occasions   Time 7   Period Weeks   Status On-going          Plan - 01/07/16 1206    Clinical Impression Statement Pt would cont to benefit from skilled ST to maximize cognitive linguistic deficits for possible return to work.   Speech Therapy Frequency 2x / week   Duration --  7 weeks   Treatment/Interventions Cognitive reorganization;SLP instruction and feedback;Compensatory strategies;Internal/external aids;Patient/family education;Functional tasks;Cueing hierarchy   Potential to Achieve Goals Good   Potential Considerations Severity of impairments   Consulted and Agree with Plan of Care Patient        Problem List Patient Active Problem List   Diagnosis Date Noted  . Blunt head trauma 12/01/2015  . Occipital scalp laceration 12/01/2015   . TBI (traumatic brain injury) (HCC) 11/26/2015    [facility] , MS, CCC-SLP  01/07/2016, 12:09 PM  [facility] [address] [place], [place], [address] Phone: [phone]   Fax:  [phone]   Name: Nicklous Aburto. MRN: [id number] Date of Birth: 18-Apr-1966

## 2016-01-07 NOTE — Therapy (Signed)
[facility] [address] [place], [place], [address] Phone: [phone]   Fax:  [phone]  Physical Therapy Treatment  Patient Details  Name: Bradley Knox. MRN: [id number] Date of Birth: 1966/04/30 Referring Provider: Earney Hamburg, MD  Encounter Date: 01/07/2016      PT End of Session - 01/07/16 0806    Visit Number 6   Number of Visits 9   Date for PT Re-Evaluation 01/16/16  extended POC due to missing one week   Authorization Type Waiting for [employer] insurance to kick in   PT Start Time 0802   PT Stop Time 0845   PT Time Calculation (min) 43 min   Activity Tolerance Patient tolerated treatment well   Behavior During Therapy [facility] for tasks assessed/performed      History reviewed. No pertinent past medical history.  History reviewed. No pertinent past surgical history.  There were no vitals filed for this visit.  Visit Diagnosis:  Abnormality of gait  Unsteadiness  Generalized weakness  Vestibular dysfunction, unspecified laterality      Subjective Assessment - 01/07/16 0804    Subjective "I still haven't called my doctor about my vision because I'm scared that they may put me on more medicine."    Limitations House hold activities;Walking;Standing   Currently in Pain? No/denies             Vestibular assessment:  Note smooth pursuits WFL, convergence grossly WFL (R eye slightly decreased but does converge), R beating nystagmus with R saccades (may have been to end range provoking nystagmus stretch reflex), single beat with L saccades, vertical saccades normal.    NMR: Reviewed HEP with gaze stabilization task.  Note that he states has been performing at home, however needs min to moderate cuing to perform accurately in clinic.  Performed horizontally and vertically x 10 reps.  Pt now reporting dizziness with vertical head turns with gaze stabilization where he did not in previous sessions.   Performed all exercises as in pt instruction.  Progressed to high level gait and dual task activity.  Performed gait with ball toss (moving eyes and head with target) without cognitive task x 115' progressing to addition of naming foods in alphabetical order.  Pt with marked difficulty maintaining physical task while naming foods or recalling which letter to state.   Requires mod to max cues throughout to continue task.  No increased dizziness with task, but did alternate between ball toss and moving ball in circular motions clockwise and counter clockwise during task.                      PT Education - 01/07/16 0805    Education provided Yes   Education Details Continue to encourage pt to notifiy MD (trauma) if vision is stil worse than baseline.  Educated on implications of waiting and how matters could worsen if he doesn't seek assist.    Person(s) Educated Patient   Methods Explanation   Comprehension Verbalized understanding             PT Long Term Goals - 12/10/15 0955    PT LONG TERM GOAL #1   Title Pt will be independent with HEP for balance in order to indicate decreased fall risk.  (Target Date: 01/07/16)   Baseline dependent   PT LONG TERM GOAL #2   Title Pt will increase gait speed to 3.41 ft/sec in order to indicate improved efficiency of gait.  Baseline 2.81 ft/sec 12/10/15   PT LONG TERM GOAL #3   Title Pt will improve FGA score to 22/30 in order to indicate functional improvement in balance and decreased fall risk.    Baseline 17/30 on 12/10/15   PT LONG TERM GOAL #4   Title Pt will perform dual task for up to 5 mins at mod I level in order to indicate safe return to work.     Baseline unable to perform 12/10/15   PT LONG TERM GOAL #5   Title Pt will perform cogntive TUG in <15 seconds while completing cognitive task with increased time.     Baseline unable to perform cognitive task 12/10/15               Plan - 01/07/16 0807    Clinical  Impression Statement Skilled session focused on assessment of compliance and performance of HEP.  Pt still needs min to moderate cues for accuracey of performance.  Also addressed high level gait with dual task activity while also addressing visual vestibular deficits.     Pt will benefit from skilled therapeutic intervention in order to improve on the following deficits Abnormal gait;Decreased activity tolerance;Decreased balance;Decreased cognition;Decreased endurance;Decreased knowledge of precautions;Decreased mobility;Decreased safety awareness;Decreased strength;Difficulty walking;Increased muscle spasms;Impaired perceived functional ability;Postural dysfunction;Pain   Rehab Potential Good   Clinical Impairments Affecting Rehab Potential cognitive impairments, financial restrictions   PT Frequency 2x / week  if able due to possible financial restrictions   PT Duration 4 weeks   PT Treatment/Interventions ADLs/Self Care Home Management;Moist Heat;Gait training;Stair training;Functional mobility training;Therapeutic activities;Therapeutic exercise;Balance training;Neuromuscular re-education;Cognitive remediation;Patient/family education   PT Next Visit Plan  dual task with high level balance, cognitive tasks   Consulted and Agree with Plan of Care Patient        Problem List Patient Active Problem List   Diagnosis Date Noted  . Blunt head trauma 12/01/2015  . Occipital scalp laceration 12/01/2015  . TBI (traumatic brain injury) (HCC) 11/26/2015    Harriet Butte, PT, MPT [facility] [address] [place], [place], [address] Phone: [phone]   Fax:  [phone] 01/07/2016, 9:06 AM  Name: Bradley Knox. MRN: [id number] Date of Birth: November 25, 1966

## 2016-01-10 ENCOUNTER — Ambulatory Visit: Payer: Self-pay | Admitting: Rehabilitation

## 2016-01-11 ENCOUNTER — Ambulatory Visit: Payer: Self-pay | Admitting: Rehabilitation

## 2016-01-11 ENCOUNTER — Ambulatory Visit: Payer: Self-pay

## 2016-01-11 ENCOUNTER — Encounter: Payer: Self-pay | Admitting: Rehabilitation

## 2016-01-11 DIAGNOSIS — R2681 Unsteadiness on feet: Secondary | ICD-10-CM

## 2016-01-11 DIAGNOSIS — R269 Unspecified abnormalities of gait and mobility: Secondary | ICD-10-CM

## 2016-01-11 DIAGNOSIS — H832X9 Labyrinthine dysfunction, unspecified ear: Secondary | ICD-10-CM

## 2016-01-11 DIAGNOSIS — R41841 Cognitive communication deficit: Secondary | ICD-10-CM

## 2016-01-11 DIAGNOSIS — R531 Weakness: Secondary | ICD-10-CM

## 2016-01-11 NOTE — Therapy (Signed)
[facility] [address] [place], [place], [address] Phone: [phone]   Fax:  [phone]  Speech Language Pathology Treatment  Patient Details  Name: Bradley Knox. MRN: [id number] Date of Birth: [birth year] Referring Provider: Violeta Gelinas, M.D.  Encounter Date: 01/11/2016      End of Session - 01/11/16 1017    Visit Number 6   Number of Visits 17   Date for SLP Re-Evaluation 02/21/16   SLP Start Time 0935   SLP Stop Time  1015   SLP Time Calculation (min) 40 min   Activity Tolerance Patient tolerated treatment well      No past medical history on file.  No past surgical history on file.  There were no vitals filed for this visit.  Visit Diagnosis: Cognitive communication deficit      Subjective Assessment - 01/11/16 1006    Subjective "I did hte mind games."   Currently in Pain? No/denies               ADULT SLP TREATMENT - 01/11/16 1006    General Information   Behavior/Cognition Alert;Cooperative;Pleasant mood   Treatment Provided   Treatment provided Cognitive-Linquistic   Cognitive-Linquistic Treatment   Treatment focused on Cognition   Skilled Treatment Pt recalled 2/4 strategies. SLP assisted pt with where to write these down in order to recall. Pt chose "memo" app on his phone.    Assessment / Recommendations / Plan   Plan Continue with current plan of care   Progression Toward Goals   Progression toward goals Progressing toward goals            SLP Short Term Goals - 01/11/16 1018    SLP SHORT TERM GOAL #1   Title pt will tell SLP four memory strategies   Time 2   Period Weeks   Status On-going   SLP SHORT TERM GOAL #2   Title pt will demo a memory strategy in a session, given 4 opportunities    Time 2   Period Weeks   Status On-going   SLP SHORT TERM GOAL #3   Title pt will answer questions re: a 3-minute selection with 90% success using memory strategies   Time 2   Period Weeks   Status On-going          SLP Long Term Goals - 01/11/16 1019    SLP LONG TERM GOAL #1   Title pt will answer questions re: a >5 minute auditory selection with 95% success using memory strategies   Time 6   Period Weeks   Status On-going   SLP LONG TERM GOAL #2   Title pt will report use of memory strategies outside clinic (or situations he could have used strateges) on 4 occasions   Time 6   Period Weeks   Status On-going          Plan - 01/11/16 1018    Clinical Impression Statement Pt would cont to benefit from skilled ST to maximize cognitive linguistic deficits for possible return to work.   Speech Therapy Frequency 2x / week   Duration --  6 weeks   Treatment/Interventions Cognitive reorganization;SLP instruction and feedback;Compensatory strategies;Internal/external aids;Patient/family education;Functional tasks;Cueing hierarchy   Potential to Achieve Goals Good   Potential Considerations Severity of impairments   Consulted and Agree with Plan of Care Patient        Problem List Patient Active Problem List   Diagnosis Date Noted  . Blunt head trauma  12/01/2015  . Occipital scalp laceration 12/01/2015  . TBI (traumatic brain injury) (HCC) 11/26/2015    [facility] , MS, CCC-SLP  01/11/2016, 10:20 AM  [facility] [address] [place], [place], [address] Phone: [phone]   Fax:  [phone]   Name: Bradley Knox. MRN: [id number] Date of Birth: 1966/02/06

## 2016-01-11 NOTE — Patient Instructions (Signed)
Jumping Jacks:  Perform jumping jack (one) and then perform a 180 deg turn (so if you are facing forward turn all the way to where your back is).  Perform x 5 reps or until dizziness increases to 3 above starting point.  If you get to a 4/10 or 4 more than where you started, stop exercise and just take a lap until your dizziness comes down.  Do 3-5 times at once and do 1-2 times a day!!

## 2016-01-11 NOTE — Therapy (Signed)
[facility] [address] [place], [place], [address] Phone: [phone]   Fax:  [phone]  Physical Therapy Treatment  Patient Details  Name: Bradley Knox. MRN: [id number] Date of Birth: [birth year] Referring Provider: Earney Hamburg, MD  Encounter Date: 01/11/2016      PT End of Session - 01/11/16 0827    Visit Number 7   Number of Visits 9   Date for PT Re-Evaluation 01/16/16  extended POC due to missing one week   Authorization Type Waiting for [employer] insurance to kick in   PT Start Time 0825   PT Stop Time 0910   PT Time Calculation (min) 45 min   Activity Tolerance Patient tolerated treatment well   Behavior During Therapy [facility] for tasks assessed/performed      History reviewed. No pertinent past medical history.  History reviewed. No pertinent past surgical history.  There were no vitals filed for this visit.  Visit Diagnosis:  Abnormality of gait  Unsteadiness  Generalized weakness  Vestibular dysfunction, unspecified laterality      Subjective Assessment - 01/11/16 0826    Subjective Reports no changes since last visit.    Limitations House hold activities;Walking;Standing   Currently in Pain? No/denies           NMR: Performed high level balance and vestibular challenge in // bars as follows; standing on small rocker board in vertical orientation with EC x 2 sets of 30 secs, EC with head turns x 10 reps and EC with head turns up/down x 10 reps.  No reports of dizziness with vertical movements today as compared to previous sessions.  Note that he states not performing exercises daily, therefore continued to provide max verbal cues for completing HEP as provided, esp the ones with pillows and eyes closed.  Progressed to standing on BOSU ball with black top up without UE support maintaining balance x 30 secs>EC x 30 secs>EO with head turns side/side x 10 reps, head turns up/down x 10 reps.   Then performed sit<>stand while feet placed on foam balance beam.  Note pt with tendency to lock BLEs and press legs against mat to stand fully, therefore provided cues for slower more controlled movement and pt able to complete 10 without UE support with no overt LOB.  Continued with session with attempts to provoke dizziness.  Performed jumping jacks x 10 reps with no increase in dizziness, therefore added jumping jack with 180 deg turn x 5 reps.  Note that this did increase dizziness to 3/10 per pt report.  Performed x another 5 reps during session and provided this for HEP, pt verbalized understanding.  Continued with horizontal movements with gait while PT held cards side to side for pt to call out.  Pt accurate in all cards and no reports of dizziness with task.    Self Care: Discussed working towards D/C on next visit as pt has appropriate exercises to assist for vestibular adaptation and that pt at functional level for DC.  Pt with concerns on returning to work, therefore educated to keep this discussion with SLP as it would likely be cognitive deficits to prevent pt from return to work.  Also discussed that when he follows up with trauma MD to ask for referral for neuro opthamologist if vision does not improve.  Pt verbalized understanding.                       PT Education -  01/11/16 0827    Education provided Yes   Education Details addition to HEP to increase vestibular adaptation   Person(s) Educated Patient   Methods Explanation;Handout   Comprehension Verbalized understanding             PT Long Term Goals - 12/10/15 0955    PT LONG TERM GOAL #1   Title Pt will be independent with HEP for balance in order to indicate decreased fall risk.  (Target Date: 01/07/16)   Baseline dependent   PT LONG TERM GOAL #2   Title Pt will increase gait speed to 3.41 ft/sec in order to indicate improved efficiency of gait.     Baseline 2.81 ft/sec 12/10/15   PT LONG TERM GOAL  #3   Title Pt will improve FGA score to 22/30 in order to indicate functional improvement in balance and decreased fall risk.    Baseline 17/30 on 12/10/15   PT LONG TERM GOAL #4   Title Pt will perform dual task for up to 5 mins at mod I level in order to indicate safe return to work.     Baseline unable to perform 12/10/15   PT LONG TERM GOAL #5   Title Pt will perform cogntive TUG in <15 seconds while completing cognitive task with increased time.     Baseline unable to perform cognitive task 12/10/15               Plan - 01/11/16 0827    Clinical Impression Statement Skilled session focused on high level balance and vestibular challenges to increase vestibular adaptation. Pt with continued varying reports of dizziness during session as today it was more with horizontal head and body movements vs vertical.  Discussed compliance with HEP and that this will help with recovery.  Also discussed D/C on next visit due to progress.     Pt will benefit from skilled therapeutic intervention in order to improve on the following deficits Abnormal gait;Decreased activity tolerance;Decreased balance;Decreased cognition;Decreased endurance;Decreased knowledge of precautions;Decreased mobility;Decreased safety awareness;Decreased strength;Difficulty walking;Increased muscle spasms;Impaired perceived functional ability;Postural dysfunction;Pain   Rehab Potential Good   Clinical Impairments Affecting Rehab Potential cognitive impairments, financial restrictions   PT Frequency 2x / week  if able due to possible financial restrictions   PT Duration 4 weeks   PT Treatment/Interventions ADLs/Self Care Home Management;Moist Heat;Gait training;Stair training;Functional mobility training;Therapeutic activities;Therapeutic exercise;Balance training;Neuromuscular re-education;Cognitive remediation;Patient/family education   PT Next Visit Plan  dual task with high level balance, cognitive tasks   Consulted and  Agree with Plan of Care Patient        Problem List Patient Active Problem List   Diagnosis Date Noted  . Blunt head trauma 12/01/2015  . Occipital scalp laceration 12/01/2015  . TBI (traumatic brain injury) (HCC) 11/26/2015    Harriet Butte, PT, MPT [facility] [address] [place], [place], [address] Phone: [phone]   Fax:  [phone] 01/11/2016, 9:15 AM  Name: Bradley Knox. MRN: [id number] Date of Birth: 08/17/1966

## 2016-01-12 ENCOUNTER — Ambulatory Visit: Payer: Self-pay | Admitting: Rehabilitation

## 2016-01-13 ENCOUNTER — Ambulatory Visit: Payer: Self-pay | Attending: General Surgery

## 2016-01-13 ENCOUNTER — Ambulatory Visit: Payer: Self-pay | Attending: Orthopedic Surgery | Admitting: Rehabilitation

## 2016-01-13 ENCOUNTER — Encounter: Payer: Self-pay | Admitting: Rehabilitation

## 2016-01-13 DIAGNOSIS — R531 Weakness: Secondary | ICD-10-CM | POA: Insufficient documentation

## 2016-01-13 DIAGNOSIS — H832X9 Labyrinthine dysfunction, unspecified ear: Secondary | ICD-10-CM | POA: Insufficient documentation

## 2016-01-13 DIAGNOSIS — R2681 Unsteadiness on feet: Secondary | ICD-10-CM | POA: Insufficient documentation

## 2016-01-13 DIAGNOSIS — R41841 Cognitive communication deficit: Secondary | ICD-10-CM | POA: Insufficient documentation

## 2016-01-13 DIAGNOSIS — R269 Unspecified abnormalities of gait and mobility: Secondary | ICD-10-CM | POA: Insufficient documentation

## 2016-01-13 NOTE — Patient Instructions (Signed)
Eye exercise: Use your "A" target that you already have. Cover one eye and move the target in the shape of an "H" in front of face (at arms length away). Perform x 3 reps and then switch and cover the other eye and repeat the "H" x 3 reps. Do this 2-3 times per day.

## 2016-01-13 NOTE — Patient Instructions (Signed)
  Please complete the assigned speech therapy homework and return it to your next session.  

## 2016-01-13 NOTE — Therapy (Signed)
[facility] [address] [place], [place], [address] Phone: [phone]   Fax:  [phone]  Speech Language Pathology Treatment  Patient Details  Name: Bradley Knox. MRN: [id number] Date of Birth: 1966-11-22 Referring Provider: Violeta Gelinas, M.D.  Encounter Date: 01/13/2016      End of Session - 01/13/16 1219    Visit Number 7   Number of Visits 17   Date for SLP Re-Evaluation 02/21/16   SLP Start Time 1017   SLP Stop Time  1100   SLP Time Calculation (min) 43 min   Activity Tolerance Patient tolerated treatment well      No past medical history on file.  No past surgical history on file.  There were no vitals filed for this visit.  Visit Diagnosis: Cognitive communication deficit      Subjective Assessment - 01/13/16 1033    Subjective "I've been working more with the memory games."   Currently in Pain? No/denies               ADULT SLP TREATMENT - 01/13/16 1033    General Information   Behavior/Cognition Alert;Cooperative;Pleasant mood   Treatment Provided   Treatment provided Cognitive-Linquistic   Cognitive-Linquistic Treatment   Treatment focused on Cognition   Skilled Treatment SLP worked with pt's deficit in memory during therapy today. He req'd min-mod A occasionally for working memory. SLP gave him homework and he did not put it in his planner/phone. Pt answered questions re: a 90-second selection 90% succes (9/10), incr'd to 10/10 with mod A.   Assessment / Recommendations / Plan   Plan Continue with current plan of care   Progression Toward Goals   Progression toward goals Progressing toward goals            SLP Short Term Goals - 01/13/16 1050    SLP SHORT TERM GOAL #1   Title pt will tell SLP four memory strategies   Status Achieved   SLP SHORT TERM GOAL #2   Title pt will demo a memory strategy in a session, given 4 opportunities    Time 2   Period Weeks   Status  On-going   SLP SHORT TERM GOAL #3   Title pt will answer questions re: a 3-minute selection with 90% success using memory strategies   Time 2   Period Weeks   Status On-going          SLP Long Term Goals - 01/13/16 1220    SLP LONG TERM GOAL #1   Title pt will answer questions re: a >5 minute auditory selection with 95% success using memory strategies   Time 6   Period Weeks   Status On-going   SLP LONG TERM GOAL #2   Title pt will report use of memory strategies outside clinic (or situations he could have used strateges) on 4 occasions   Time 6   Period Weeks   Status On-going          Plan - 01/13/16 1219    Clinical Impression Statement Pt would cont to benefit from skilled ST to maximize cognitive linguistic deficits for possible return to work.   Speech Therapy Frequency 2x / week   Duration --  6 weeks   Treatment/Interventions Cognitive reorganization;SLP instruction and feedback;Compensatory strategies;Internal/external aids;Patient/family education;Functional tasks;Cueing hierarchy   Potential to Achieve Goals Good   Potential Considerations Severity of impairments   Consulted and Agree with Plan of Care Patient  Problem List Patient Active Problem List   Diagnosis Date Noted  . Blunt head trauma 12/01/2015  . Occipital scalp laceration 12/01/2015  . TBI (traumatic brain injury) (HCC) 11/26/2015    [facility] , MS, CCC-SLP  01/13/2016, 12:21 PM  [place] [facility] [address] [place], [place], [address] Phone: [phone]   Fax:  [phone]   Name: Bradley Knox. MRN: [id number] Date of Birth: 28-Oct-1966

## 2016-01-13 NOTE — Therapy (Signed)
[facility] [address] [place], [place], [address] Phone: [phone]   Fax:  [phone]  Physical Therapy Treatment and D/C Summary  Patient Details  Name: Bradley Knox. MRN: [id number] Date of Birth: 11-14-1966 Referring Provider: Hilbert Odor, MD  Encounter Date: 01/13/2016      PT End of Session - 01/13/16 0925    Visit Number 8   Number of Visits 9   Date for PT Re-Evaluation 01/16/16  extended POC due to missing one week   Authorization Type Waiting for [employer] insurance to kick in   PT Start Time [id number]   PT Stop Time 1013   PT Time Calculation (min) 50 min   Activity Tolerance Patient tolerated treatment well   Behavior During Therapy [facility] for tasks assessed/performed      History reviewed. No pertinent past medical history.  History reviewed. No pertinent past surgical history.  There were no vitals filed for this visit.  Visit Diagnosis:  Unsteadiness  Abnormality of gait  Generalized weakness  Vestibular dysfunction, unspecified laterality      Subjective Assessment - 01/13/16 0924    Subjective "I did get dizzy last night when I went to lay down"    Limitations House hold activities;Walking;Standing   Currently in Pain? No/denies            [facility] PT Assessment - 01/13/16 0933    Functional Gait  Assessment   Gait assessed  Yes   Gait Level Surface Walks 20 ft in less than 7 sec but greater than 5.5 sec, uses assistive device, slower speed, mild gait deviations, or deviates 6-10 in outside of the 12 in walkway width.   Change in Gait Speed Able to smoothly change walking speed without loss of balance or gait deviation. Deviate no more than 6 in outside of the 12 in walkway width.   Gait with Horizontal Head Turns Performs head turns smoothly with no change in gait. Deviates no more than 6 in outside 12 in walkway width   Gait with Vertical Head Turns Performs head turns with no  change in gait. Deviates no more than 6 in outside 12 in walkway width.   Gait and Pivot Turn Pivot turns safely within 3 sec and stops quickly with no loss of balance.   Step Over Obstacle Is able to step over one shoe box (4.5 in total height) without changing gait speed. No evidence of imbalance.   Gait with Narrow Base of Support Is able to ambulate for 10 steps heel to toe with no staggering.   Gait with Eyes Closed Walks 20 ft, uses assistive device, slower speed, mild gait deviations, deviates 6-10 in outside 12 in walkway width. Ambulates 20 ft in less than 9 sec but greater than 7 sec.   Ambulating Backwards Walks 20 ft, uses assistive device, slower speed, mild gait deviations, deviates 6-10 in outside 12 in walkway width.   Steps Alternating feet, no rail.   Total Score 26        NMR:  Performed FGA during session with new score of 26/30, demonstrating marked improvement from last session.  See details above.  Educated on meaning of balance test.  Also performed current HEP during session.  Pt able to recall most exercises without handout, min use of handout to recall gaze stabilization and eye ROM exercise.  See pt instruction for details.  Re-assessed SOT today with new composite score of 72, increased from 63 on 12/27/15.  Educated on meaning of results and improvements that he has made with visual and vestibular systems to increase balance.    Self Care:  Continue to educate on compliance with HEP for continued progress.  Pt with varying and inconsistent findings from a vestibular and visual standpoint, therefore recommend he make follow up with trauma MD and may need possible referral to neuro opthamologist.  Pt verbalized understanding.               [facility] PT Treatment/Exercise - 01/13/16 0936    Standardized Balance Assessment   Standardized Balance Assessment Timed Up and Go Test   Timed Up and Go Test   TUG Cognitive TUG   Cognitive TUG (seconds) 13.03  was  able to count, but note several mistakes during task                PT Education - 01/13/16 0925    Education provided Yes   Education Details compliance with HEP for continued success, making follow up appt with trauma MD regaring vision (may need neuro opthamologist referral).     Person(s) Educated Patient   Methods Explanation   Comprehension Verbalized understanding             PT Long Term Goals - 01/13/16 0926    PT LONG TERM GOAL #1   Title Pt will be independent with HEP for balance in order to indicate decreased fall risk.  (Target Date: 01/07/16)   Baseline met 01/13/16    Status Achieved   PT LONG TERM GOAL #2   Title Pt will increase gait speed to 3.41 ft/sec in order to indicate improved efficiency of gait.     Baseline 4.02 ft/sec on 01/13/16   Status Achieved   PT LONG TERM GOAL #3   Title Pt will improve FGA score to 22/30 in order to indicate functional improvement in balance and decreased fall risk.    Baseline 26/30 on 01/13/16   Status Achieved   PT LONG TERM GOAL #4   Title Pt will perform dual task for up to 5 mins at mod I level in order to indicate safe return to work.     Baseline met 01/13/16   Status Achieved   PT LONG TERM GOAL #5   Title Pt will perform cogntive TUG in <15 seconds while completing cognitive task with increased time.     Baseline 13.03 seconds on 01/13/16 with several mistakes, but was better able to perform vs evaluation day.    Status Achieved               Plan - 01/13/16 0925    Clinical Impression Statement Skilled session focused on assesment of LTG"s and D/C.  Pt has met all 5/5 LTG's and is ready for D/C.  Went over all HEP, again pts syptoms are very inconsistent and varying from session to session.  Recommended that he contact trauma MD for follow up appt to discuss visual changes and possible referral to neuro opthamologist.  Pt verbalized understanding.     Pt will benefit from skilled therapeutic intervention  in order to improve on the following deficits Abnormal gait;Decreased activity tolerance;Decreased balance;Decreased cognition;Decreased endurance;Decreased knowledge of precautions;Decreased mobility;Decreased safety awareness;Decreased strength;Difficulty walking;Increased muscle spasms;Impaired perceived functional ability;Postural dysfunction;Pain   Rehab Potential Good   Clinical Impairments Affecting Rehab Potential cognitive impairments, financial restrictions   PT Frequency 2x / week  if able due to possible financial restrictions   PT Duration 4 weeks  PT Treatment/Interventions ADLs/Self Care Home Management;Moist Heat;Gait training;Stair training;Functional mobility training;Therapeutic activities;Therapeutic exercise;Balance training;Neuromuscular re-education;Cognitive remediation;Patient/family education   PT Next Visit Plan n/a   Consulted and Agree with Plan of Care Patient       PHYSICAL THERAPY DISCHARGE SUMMARY  Visits from Start of Care: 8  Current functional level related to goals / functional outcomes: See LTG's    Remaining deficits: Pt with very high level balance and vestibular deficits, have given HEP to progress.    Education / Equipment: HEP, education on medical follow up   Plan: Patient agrees to discharge.  Patient goals were met. Patient is being discharged due to meeting the stated rehab goals.  ?????        Problem List Patient Active Problem List   Diagnosis Date Noted  . Blunt head trauma 12/01/2015  . Occipital scalp laceration 12/01/2015  . TBI (traumatic brain injury) ([place]) 11/26/2015    Cameron Sprang, PT, MPT [facility] [address] [place], [place], [address] Phone: [phone]   Fax:  [phone] 01/13/2016, 12:11 PM  Name: Bradley Knox. MRN: [id number] Date of Birth: [birth year]

## 2016-01-17 ENCOUNTER — Ambulatory Visit: Payer: Self-pay | Admitting: Rehabilitation

## 2016-01-17 ENCOUNTER — Ambulatory Visit: Payer: Self-pay

## 2016-01-17 DIAGNOSIS — R41841 Cognitive communication deficit: Secondary | ICD-10-CM

## 2016-01-17 NOTE — Patient Instructions (Signed)
Write down what you had for breakfast and dinner until I see you again.

## 2016-01-17 NOTE — Therapy (Signed)
[facility] [address] [place], [place], [address] Phone: [phone]   Fax:  [phone]  Speech Language Pathology Treatment  Patient Details  Name: Bradley Knox. MRN: [id number] Date of Birth: Apr 25, 1966 Referring Provider: Violeta Gelinas, M.D.  Encounter Date: 01/17/2016      End of Session - 01/17/16 1012    Visit Number 8   Number of Visits 17   Date for SLP Re-Evaluation 02/21/16   SLP Start Time 0932   SLP Stop Time  1015   SLP Time Calculation (min) 43 min   Activity Tolerance Patient tolerated treatment well      No past medical history on file.  No past surgical history on file.  There were no vitals filed for this visit.  Visit Diagnosis: Cognitive communication deficit      Subjective Assessment - 01/17/16 0936    Subjective Pt provided SLP his homework immediately upon entry to ST room.   Currently in Pain? No/denies               ADULT SLP TREATMENT - 01/17/16 0936    General Information   Behavior/Cognition Alert;Cooperative;Pleasant mood   Treatment Provided   Treatment provided Cognitive-Linquistic   Cognitive-Linquistic Treatment   Treatment focused on Cognition   Skilled Treatment Pt recalled to write down the weather and high temperature each day, despite not putting it into his phone. SLP worked on pt's difficulty with longer auditory information by playing news stories and asking pt questions re: information. He mixed facts from the selection to provide SLP incorrect answers. Pt is well-versed with compensating in classroom situations due to his dyslexia, and SLP ensured he would cont to record lectures and sermon material to relisten later.    Assessment / Recommendations / Plan   Plan Continue with current plan of care   Progression Toward Goals   Progression toward goals Progressing toward goals          SLP Education - 01/17/16 1010    Education provided Yes    Education Details need to continue to use digital recorder for lectures, sermons, etc and review it, to ensure he understands the details   Person(s) Educated Patient   Methods Explanation   Comprehension Verbalized understanding          SLP Short Term Goals - 01/17/16 0940    SLP SHORT TERM GOAL #1   Title pt will tell SLP four memory strategies   Status Achieved   SLP SHORT TERM GOAL #2   Title pt will demo a memory strategy in a session, given 4 opportunities    Time 2   Period Weeks   Status On-going   SLP SHORT TERM GOAL #3   Title pt will answer questions re: a 3-minute selection with 90% success using memory strategies   Time 2   Period Weeks   Status On-going          SLP Long Term Goals - 01/17/16 1013    SLP LONG TERM GOAL #1   Title pt will answer questions re: a >5 minute auditory selection with 95% success using memory strategies   Time 5   Period Weeks   Status On-going   SLP LONG TERM GOAL #2   Title pt will report use of memory strategies outside clinic (or situations he could have used strateges) on 4 occasions   Time 5   Period Weeks   Status On-going  Plan - 01/17/16 1012    Clinical Impression Statement Pt would cont to benefit from skilled ST to maximize cognitive linguistic deficits for possible return to work.   Speech Therapy Frequency 2x / week   Duration --  5 weeks   Treatment/Interventions Cognitive reorganization;SLP instruction and feedback;Compensatory strategies;Internal/external aids;Patient/family education;Functional tasks;Cueing hierarchy   Potential to Achieve Goals Good   Potential Considerations Severity of impairments   Consulted and Agree with Plan of Care Patient        Problem List Patient Active Problem List   Diagnosis Date Noted  . Blunt head trauma 12/01/2015  . Occipital scalp laceration 12/01/2015  . TBI (traumatic brain injury) ([facility]) 11/26/2015    [facility] , MS, CCC-SLP 01/17/2016, 10:14  AM  [facility] [address] [place], [place], [address] Phone: [phone]   Fax:  [phone]   Name: Bradley Knox. MRN: [id number] Date of Birth: 26-Feb-1966

## 2016-01-20 ENCOUNTER — Ambulatory Visit: Payer: Self-pay | Admitting: Rehabilitation

## 2016-01-20 ENCOUNTER — Ambulatory Visit: Payer: Self-pay | Admitting: Speech Pathology

## 2016-01-20 DIAGNOSIS — R41841 Cognitive communication deficit: Secondary | ICD-10-CM

## 2016-01-20 NOTE — Therapy (Signed)
[facility] [address] [place], [place], [address] Phone: [phone]   Fax:  [phone]  Speech Language Pathology Treatment  Patient Details  Name: Bradley Knox. MRN: [id number] Date of Birth: 19-Nov-1966 Referring Provider: Violeta Gelinas, M.D.  Encounter Date: 01/20/2016      End of Session - 01/20/16 1210    Visit Number 9   Number of Visits 17   Date for SLP Re-Evaluation 02/21/16   SLP Start Time 0934   SLP Stop Time  1015   SLP Time Calculation (min) 41 min      No past medical history on file.  No past surgical history on file.  There were no vitals filed for this visit.  Visit Diagnosis: Cognitive communication deficit      Subjective Assessment - 01/20/16 0949    Subjective Pt provided homework with one request.   Currently in Pain? No/denies               ADULT SLP TREATMENT - 01/20/16 0954    General Information   Behavior/Cognition Alert;Cooperative;Pleasant mood   Cognitive-Linquistic Treatment   Treatment focused on Cognition   Skilled Treatment Pt recalled to write down his meals. Facilitated short term recall - of 3 moderately complex  card sort rules with 90% accuracy and rare min A. Recall of details from 3-5 paragraph passages orally ready by ST with 85% accuracy with no multiple choice. Mutiple choice answers improved accuracy to 95%. Pt verbalized memory and attention compensations with min questioning cues. He is to put is ST appointments in his phone calendar.   Assessment / Recommendations / Plan   Plan Continue with current plan of care   Progression Toward Goals   Progression toward goals Progressing toward goals          SLP Education - 01/20/16 1209    Education provided Yes   Education Details compensations for attention and memory   Person(s) Educated Patient   Methods Explanation   Comprehension Verbalized understanding          SLP Short Term  Goals - 01/20/16 1210    SLP SHORT TERM GOAL #1   Title pt will tell SLP four memory strategies   Status Achieved   SLP SHORT TERM GOAL #2   Title pt will demo a memory strategy in a session, given 4 opportunities    Time 2   Period Weeks   Status On-going   SLP SHORT TERM GOAL #3   Title pt will answer questions re: a 3-minute selection with 90% success using memory strategies   Time 2   Period Weeks   Status On-going          SLP Long Term Goals - 01/20/16 1210    SLP LONG TERM GOAL #1   Title pt will answer questions re: a >5 minute auditory selection with 95% success using memory strategies   Time 5   Period Weeks   Status On-going   SLP LONG TERM GOAL #2   Title pt will report use of memory strategies outside clinic (or situations he could have used strateges) on 4 occasions   Time 5   Period Weeks   Status On-going          Plan - 01/20/16 1209    Clinical Impression Statement Pt would cont to benefit from skilled ST to maximize cognitive linguistic deficits for possible return to work.   Speech Therapy Frequency 2x / week  Treatment/Interventions Cognitive reorganization;SLP instruction and feedback;Compensatory strategies;Internal/external aids;Patient/family education;Functional tasks;Cueing hierarchy   Potential to Achieve Goals Good   Potential Considerations Severity of impairments   Consulted and Agree with Plan of Care Patient        Problem List Patient Active Problem List   Diagnosis Date Noted  . Blunt head trauma 12/01/2015  . Occipital scalp laceration 12/01/2015  . TBI (traumatic brain injury) ([facility]) 11/26/2015    Zeyad Delaguila, Radene Journey MS, CCC-SLP 01/20/2016, 12:11 PM  [facility] [address] [place], [place], [address] Phone: [phone]   Fax:  [phone]   Name: Sulo Janczak. MRN: [id number] Date of Birth: [birth year]

## 2016-01-24 ENCOUNTER — Ambulatory Visit: Payer: Self-pay

## 2016-01-24 DIAGNOSIS — R41841 Cognitive communication deficit: Secondary | ICD-10-CM

## 2016-01-24 NOTE — Therapy (Signed)
[facility] [address] [place], [place], [address] Phone: [phone]   Fax:  [phone]  Speech Language Pathology Treatment  Patient Details  Name: Bradley Knox. MRN: [id number] Date of Birth: [birth year] Referring Provider: Violeta Gelinas, M.D.  Encounter Date: 01/24/2016      End of Session - 01/24/16 1104    Visit Number 10   Number of Visits 17   Date for SLP Re-Evaluation 02/21/16   SLP Start Time 1017   SLP Stop Time  1100   SLP Time Calculation (min) 43 min   Activity Tolerance Patient tolerated treatment well      No past medical history on file.  No past surgical history on file.  There were no vitals filed for this visit.  Visit Diagnosis: Cognitive communication deficit      Subjective Assessment - 01/24/16 1028    Subjective Pt presented homework immediately.   Currently in Pain? No/denies               ADULT SLP TREATMENT - 01/24/16 1028    General Information   Behavior/Cognition Alert;Cooperative;Pleasant mood   Treatment Provided   Treatment provided Cognitive-Linquistic   Cognitive-Linquistic Treatment   Treatment focused on Cognition   Skilled Treatment Pt reports he is to get an apartment via state HHS. Details of moving into apartment Wednesday; pt unsure if furnished or assistance with furnishings, will be resonsible to pay for utilities. Pt hopes to start back to work  Pt believes that workplace may have changed a few things around since he was last there but pt stated he would put those changes into his phone and could use if necessary. (+Anticipatory awareness). Pt used memory strategy (write it down) for previous homework, and for homework provided by this SLP two sessions ago. SLP assisted pt download an app on his new phone for recording long discourse, as pt stated he could use an app for sermons and longer discourse but did not have one on his new phone.    Assessment / Recommendations / Plan   Plan Continue with current plan of care   Progression Toward Goals   Progression toward goals Progressing toward goals  possible decr to x1/week next session            SLP Short Term Goals - 01/24/16 1037    SLP SHORT TERM GOAL #1   Title pt will tell SLP four memory strategies   Status Achieved   SLP SHORT TERM GOAL #2   Title pt will demo a memory strategy in a session, given 4 opportunities    Status Achieved   SLP SHORT TERM GOAL #3   Title pt will answer questions re: a 3-minute selection with 90% success using memory strategies   Time 2   Period Weeks   Status On-going          SLP Long Term Goals - 01/24/16 1108    SLP LONG TERM GOAL #1   Title pt will answer questions re: a >5 minute auditory selection with 95% success using memory strategies   Time 5   Period Weeks   Status On-going   SLP LONG TERM GOAL #2   Title pt will report use of memory strategies outside clinic (or situations he could have used strateges) on 4 occasions   Baseline two occasions 01-21-16   Time 5   Period Weeks   Status On-going          Plan -  01/24/16 1105    Clinical Impression Statement Pt would cont to benefit from skilled ST to maximize cognitive linguistic skills and compensations for return to work. SLP questions if some of pt's deficits were premorbid due to limited reading/writing ability.  Possible decr to x1/week next week, as pt has compenstions in place for memory and for auditory memory.   Speech Therapy Frequency 2x / week   Duration 4 weeks   Treatment/Interventions Cognitive reorganization;SLP instruction and feedback;Compensatory strategies;Internal/external aids;Patient/family education;Functional tasks;Cueing hierarchy   Potential to Achieve Goals Good        Problem List Patient Active Problem List   Diagnosis Date Noted  . Blunt head trauma 12/01/2015  . Occipital scalp laceration 12/01/2015  . TBI (traumatic  brain injury) ([facility]) 11/26/2015    [facility] ,MS, CCC-SLP  01/24/2016, 11:09 AM  [facility] [address] [place], [place], [address] Phone: [phone]   Fax:  [phone]   Name: Milind Raether. MRN: [id number] Date of Birth: 1965/12/29

## 2016-01-24 NOTE — Patient Instructions (Signed)
Use the new app on your phone to record the message Wednesday night at church and see if it helps you recall information.

## 2016-01-27 ENCOUNTER — Ambulatory Visit: Payer: Self-pay | Admitting: Speech Pathology

## 2016-01-27 DIAGNOSIS — R41841 Cognitive communication deficit: Secondary | ICD-10-CM

## 2016-01-27 NOTE — Therapy (Signed)
[facility] [address] [place], [place], [address] Phone: [phone]   Fax:  [phone]  Speech Language Pathology Treatment  Patient Details  Name: Bradley Knox. MRN: [id number] Date of Birth: 1966/07/19 Referring Provider: Violeta Gelinas, M.D.  Encounter Date: 01/27/2016      End of Session - 01/27/16 1207    Visit Number 11   Number of Visits 17   Date for SLP Re-Evaluation 02/21/16   SLP Start Time 1018   SLP Stop Time  1101   SLP Time Calculation (min) 43 min      No past medical history on file.  No past surgical history on file.  There were no vitals filed for this visit.  Visit Diagnosis: Cognitive communication deficit      Subjective Assessment - 01/27/16 1023    Subjective Pt recorded Bible study lesson   Currently in Pain? No/denies               ADULT SLP TREATMENT - 01/27/16 1025    General Information   Behavior/Cognition Alert;Cooperative;Pleasant mood   Treatment Provided   Treatment provided Cognitive-Linquistic   Cognitive-Linquistic Treatment   Treatment focused on Cognition   Skilled Treatment Pt recorded Bible study lesson on his phone. Auditory memory for information presetnted over 8 minutes with rare min A.  Pt verbalized compensations for memory and attention with occasionall min cues.    Assessment / Recommendations / Plan   Plan Continue with current plan of care            SLP Short Term Goals - 01/27/16 1206    SLP SHORT TERM GOAL #1   Title pt will tell SLP four memory strategies   Status Achieved   SLP SHORT TERM GOAL #2   Title pt will demo a memory strategy in a session, given 4 opportunities    Status Achieved   SLP SHORT TERM GOAL #3   Title pt will answer questions re: a 3-minute selection with 90% success using memory strategies   Time 2   Period Weeks   Status Achieved          SLP Long Term Goals - 01/27/16 1207    SLP LONG TERM  GOAL #1   Title pt will answer questions re: a >5 minute auditory selection with 95% success using memory strategies   Time 5   Period Weeks   Status On-going   SLP LONG TERM GOAL #2   Title pt will report use of memory strategies outside clinic (or situations he could have used strateges) on 4 occasions   Baseline 3 occasions 01/27/16   Time 5   Period Weeks   Status On-going          Plan - 01/27/16 1205    Clinical Impression Statement Pt making good progress, utilizing memory strategies, external memory aids for auditory information outside of therapy. Recommend pt reduce frequency to 1x a week for 1-2 more weeks to maximize carryover of compensations for cognitive impairments for return to work/school   Speech Therapy Frequency 1x /week   Treatment/Interventions Cognitive reorganization;SLP instruction and feedback;Compensatory strategies;Internal/external aids;Patient/family education;Functional tasks;Cueing hierarchy   Potential to Achieve Goals Good   Consulted and Agree with Plan of Care Patient        Problem List Patient Active Problem List   Diagnosis Date Noted  . Blunt head trauma 12/01/2015  . Occipital scalp laceration 12/01/2015  . TBI (traumatic brain injury) ([facility]) 11/26/2015  [place], Radene Journey MS, CCC-SLP 01/27/2016, 12:10 PM  [facility] [address] [place], [place], [address] Phone: [phone]   Fax:  [phone]   Name: Bradley Knox. MRN: [id number] Date of Birth: [birth year]

## 2016-01-31 ENCOUNTER — Ambulatory Visit: Payer: Self-pay

## 2016-02-03 ENCOUNTER — Ambulatory Visit: Payer: Self-pay

## 2016-02-03 DIAGNOSIS — R41841 Cognitive communication deficit: Secondary | ICD-10-CM
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
## 2016-02-03 NOTE — Patient Instructions (Signed)
Continue to use recording app. Look at minute and seconds (like we practiced), and write it down with a few words to help you remember topics, so you can to back later if you need to.

## 2016-09-29 IMAGING — CT CT HEAD W/O CM
2 series · 15 of 30 positions shown, 17 images · non-contrast
Comparison: Most recent imaging 11/29/2015.

CLINICAL DATA: Continued symptoms after head trauma.

EXAM:
CT HEAD WITHOUT CONTRAST
TECHNIQUE: Contiguous axial images were obtained from the base of the skull
through the vertex without intravenous contrast.

[Series 2: head without · axial · non-contrast · 0.43mm/px · z∈[-122,-7]mm · 7 of 31 slices shown, 9 images]
[im 4/31  brain]
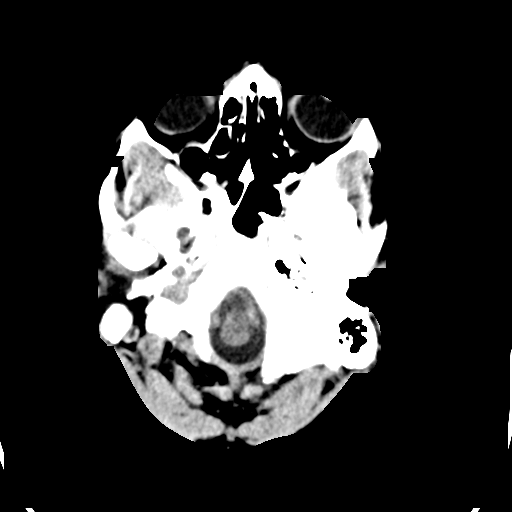
[im 4/31  bone]
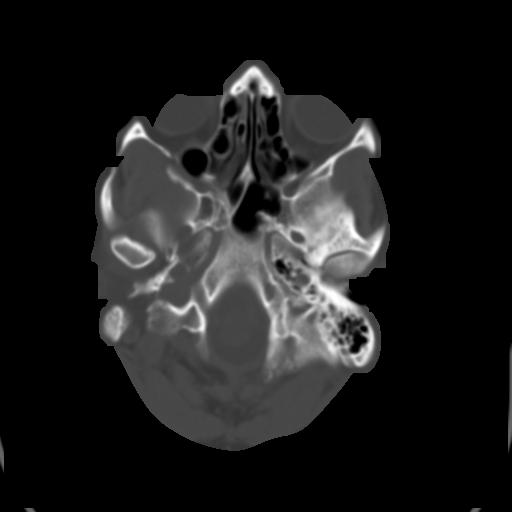
[im 8/31  brain]
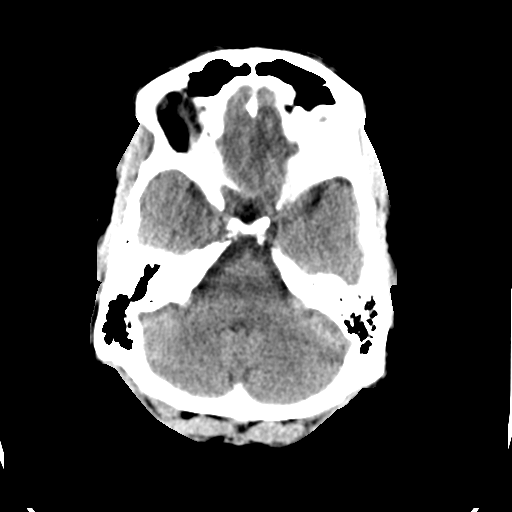
[im 12/31  brain]
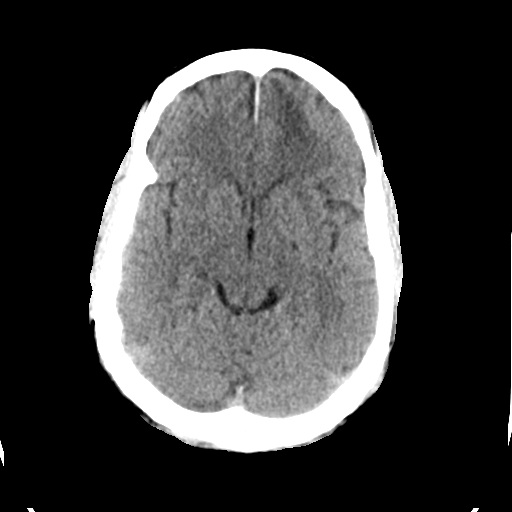
[im 16/31  brain]
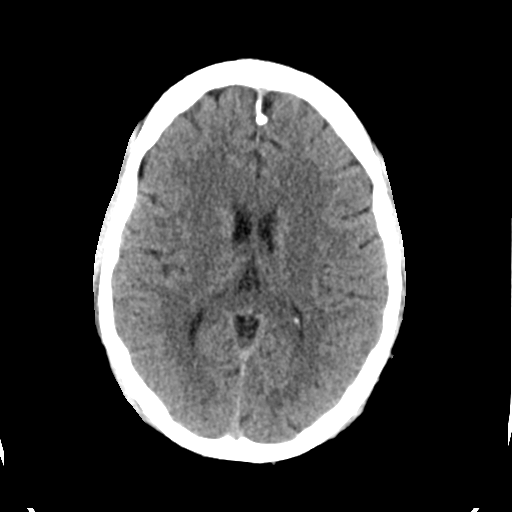
[im 19/31  brain]
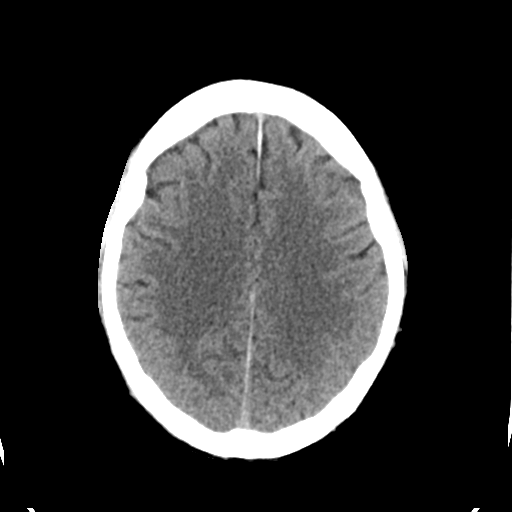
[im 19/31  bone]
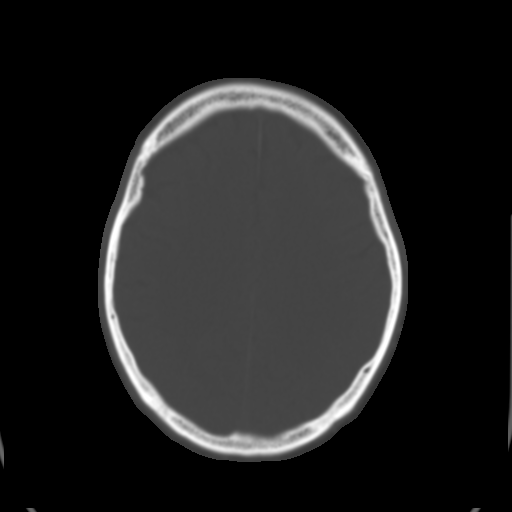
[im 23/31  brain]
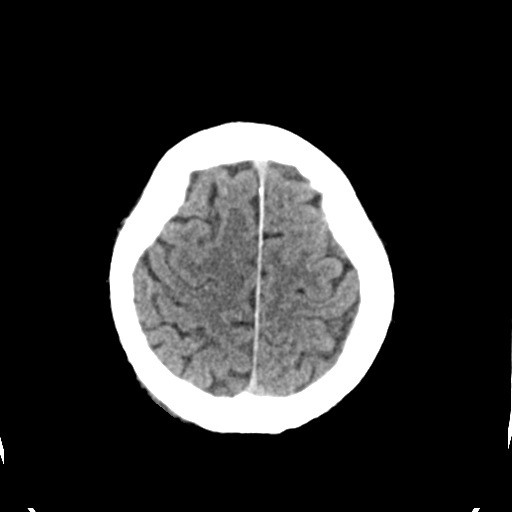
[im 27/31  brain]
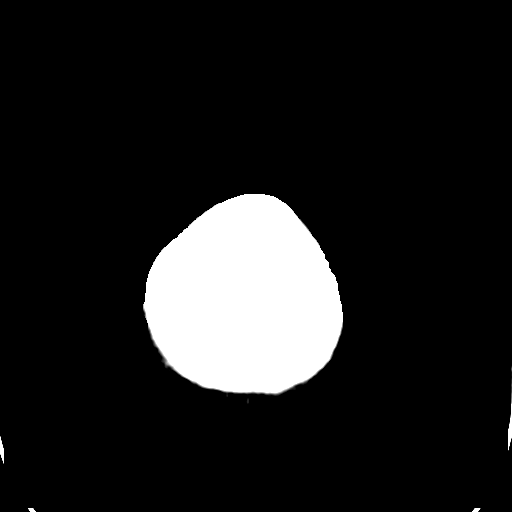

[Series 3: head bone · axial · 0.43mm/px · z∈[-123,-3]mm · 8 of 76 slices shown]
[im 8/76  bone]
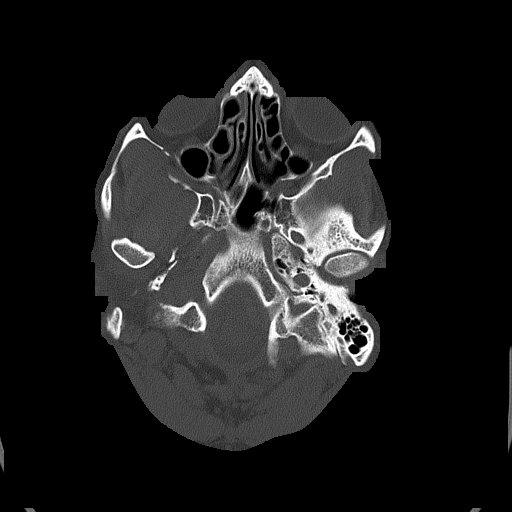
[im 16/76  bone]
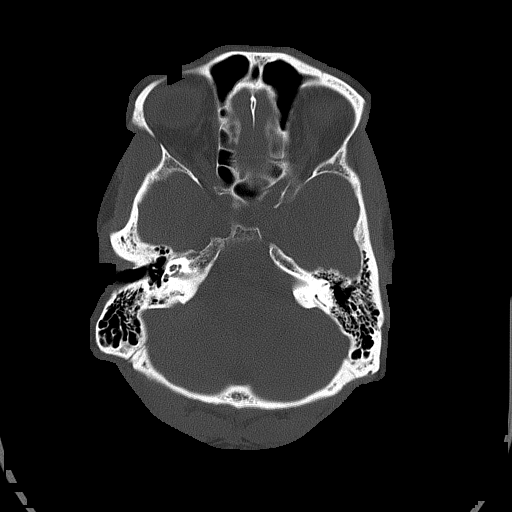
[im 23/76  bone]
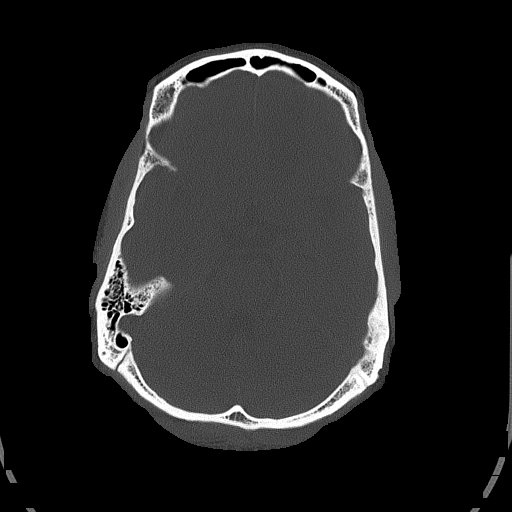
[im 34/76  bone]
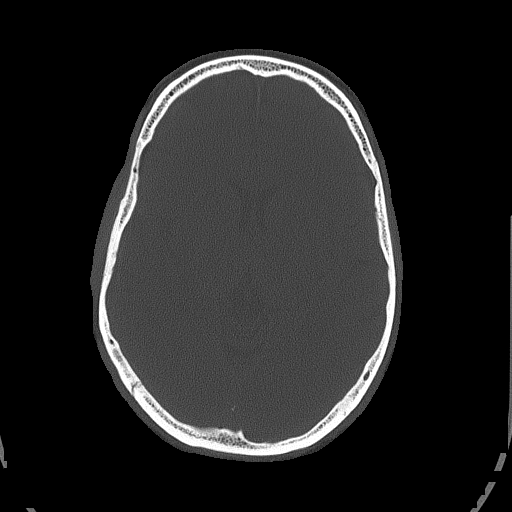
[im 42/76  bone]
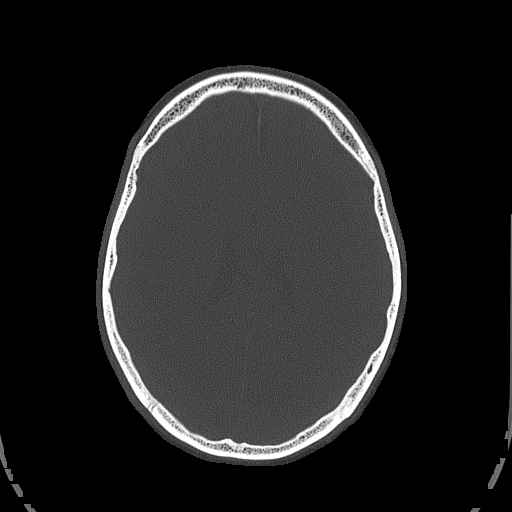
[im 53/76  bone]
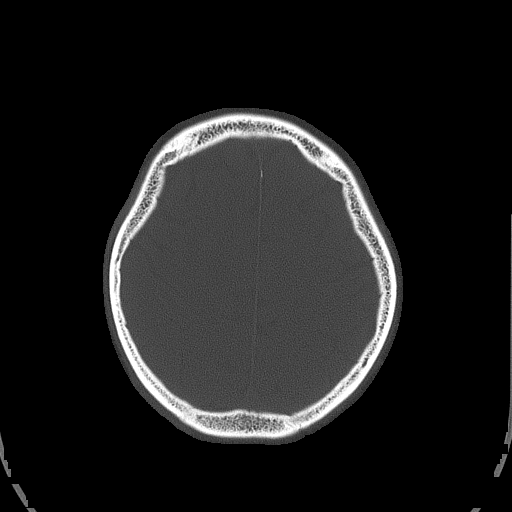
[im 61/76  bone]
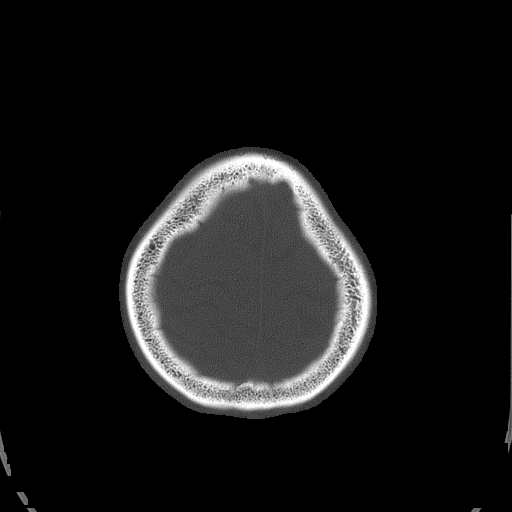
[im 68/76  bone]
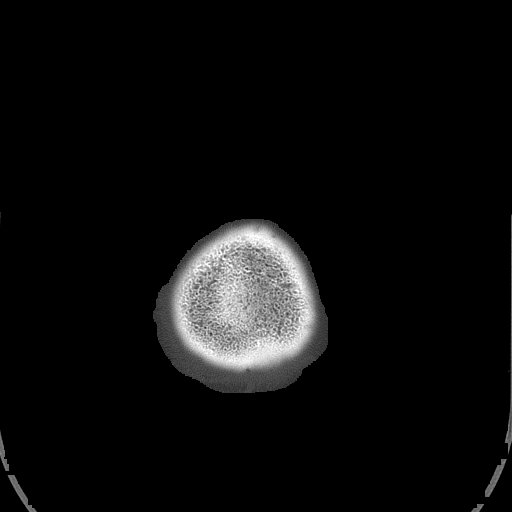

[15 of 30 positions shown; findings below may reference images not displayed]

Initial cranial imaging
on [DATE] following unknown closed head injury with laceration.
FINDINGS: Resolving bifrontal contusions, with persistent inferior frontal
lobe edema on the LEFT. The edema is improved compared with most
recent priors.

Small LEFT frontal subdural collections, noted on the initial exam,
have also resolved, without recurrence or development of subdural
hygroma.

No new parenchymal contusions, hydrocephalus, or midline shift.

Calvarium remains intact. No sinus air-fluid level. Negative
mastoids. No orbital findings.
IMPRESSION: Resolving bifrontal contusions and resolve subdural blood
collection.

Persistent but improved LEFT frontal lobe edema sequelae of closed
head injury.

No new parenchymal hemorrhage or hydrocephalus. No occult skull
fracture or sinus air-fluid level.

## 2019-12-31 ENCOUNTER — Emergency Department (HOSPITAL_COMMUNITY)
Admission: EM | Admit: 2019-12-31 | Discharge: 2019-12-31 | Disposition: A | Discharge status: Home / Self Care | Payer: BC Managed Care – PPO | Attending: Emergency Medicine | Admitting: Emergency Medicine

## 2019-12-31 ENCOUNTER — Encounter (HOSPITAL_COMMUNITY): Payer: Self-pay

## 2019-12-31 ENCOUNTER — Other Ambulatory Visit: Payer: Self-pay

## 2019-12-31 ENCOUNTER — Emergency Department (HOSPITAL_COMMUNITY): Payer: BC Managed Care – PPO

## 2019-12-31 DIAGNOSIS — R071 Chest pain on breathing: Secondary | ICD-10-CM | POA: Diagnosis not present

## 2019-12-31 DIAGNOSIS — Y9389 Activity, other specified: Secondary | ICD-10-CM | POA: Diagnosis not present

## 2019-12-31 DIAGNOSIS — S2220XA Unspecified fracture of sternum, initial encounter for closed fracture: Secondary | ICD-10-CM | POA: Insufficient documentation

## 2019-12-31 DIAGNOSIS — N281 Cyst of kidney, acquired: Secondary | ICD-10-CM

## 2019-12-31 DIAGNOSIS — Y998 Other external cause status: Secondary | ICD-10-CM | POA: Diagnosis not present

## 2019-12-31 DIAGNOSIS — Y92410 Unspecified street and highway as the place of occurrence of the external cause: Secondary | ICD-10-CM | POA: Diagnosis not present

## 2019-12-31 DIAGNOSIS — W2210XA Striking against or struck by unspecified automobile airbag, initial encounter: Secondary | ICD-10-CM | POA: Insufficient documentation

## 2019-12-31 DIAGNOSIS — S299XXA Unspecified injury of thorax, initial encounter: Secondary | ICD-10-CM | POA: Diagnosis present

## 2019-12-31 LAB — CBC WITH DIFFERENTIAL/PLATELET
Abs Immature Granulocytes: 0.07 10*3/uL (ref 0.00–0.07)
Basophils Absolute: 0 10*3/uL (ref 0.0–0.1)
Basophils Relative: 0 %
Eosinophils Absolute: 0 10*3/uL (ref 0.0–0.5)
Eosinophils Relative: 0 %
HCT: 44.5 % (ref 39.0–52.0)
Hemoglobin: 14.5 g/dL (ref 13.0–17.0)
Immature Granulocytes: 1 %
Lymphocytes Relative: 7 %
Lymphs Abs: 0.9 10*3/uL (ref 0.7–4.0)
MCH: 27.7 pg (ref 26.0–34.0)
MCHC: 32.6 g/dL (ref 30.0–36.0)
MCV: 85.1 fL (ref 80.0–100.0)
Monocytes Absolute: 0.5 10*3/uL (ref 0.1–1.0)
Monocytes Relative: 4 %
Neutro Abs: 11.5 10*3/uL — ABNORMAL HIGH (ref 1.7–7.7)
Neutrophils Relative %: 88 %
Platelets: 164 10*3/uL (ref 150–400)
RBC: 5.23 MIL/uL (ref 4.22–5.81)
RDW: 14.2 % (ref 11.5–15.5)
WBC: 13 10*3/uL — ABNORMAL HIGH (ref 4.0–10.5)
nRBC: 0 % (ref 0.0–0.2)

## 2019-12-31 LAB — COMPREHENSIVE METABOLIC PANEL
ALT: 24 U/L (ref 0–44)
AST: 28 U/L (ref 15–41)
Albumin: 3.6 g/dL (ref 3.5–5.0)
Alkaline Phosphatase: 53 U/L (ref 38–126)
Anion gap: 10 (ref 5–15)
BUN: 9 mg/dL (ref 6–20)
CO2: 27 mmol/L (ref 22–32)
Calcium: 8.4 mg/dL — ABNORMAL LOW (ref 8.9–10.3)
Chloride: 101 mmol/L (ref 98–111)
Creatinine, Ser: 1.2 mg/dL (ref 0.61–1.24)
GFR calc Af Amer: >60 mL/min (ref >60)
GFR calc non Af Amer: >60 mL/min (ref >60)
Glucose, Bld: 128 mg/dL — ABNORMAL HIGH (ref 70–99)
Potassium: 4 mmol/L (ref 3.5–5.1)
Sodium: 138 mmol/L (ref 135–145)
Total Bilirubin: 0.6 mg/dL (ref 0.3–1.2)
Total Protein: 6.1 g/dL — ABNORMAL LOW (ref 6.5–8.1)

## 2019-12-31 LAB — TROPONIN I (HIGH SENSITIVITY): Troponin I (High Sensitivity): <2 ng/L (ref <18)

## 2019-12-31 MED ORDER — MORPHINE SULFATE (PF) 2 MG/ML IV SOLN
2.0000 mg | Freq: Once | INTRAVENOUS | Status: AC
  Administered 2019-12-31: 2 mg via INTRAVENOUS
  Filled 2019-12-31: qty 1

## 2019-12-31 MED ORDER — ONDANSETRON HCL 4 MG/2ML IJ SOLN
4.0000 mg | Freq: Once | INTRAMUSCULAR | Status: AC
  Administered 2019-12-31: 14:00:00 4 mg via INTRAVENOUS
  Filled 2019-12-31: qty 2

## 2019-12-31 MED ORDER — OXYCODONE-ACETAMINOPHEN 5-325 MG PO TABS
1.0000 | ORAL_TABLET | Freq: Once | ORAL | Status: AC
  Administered 2019-12-31: 1 via ORAL
  Filled 2019-12-31: qty 1

## 2019-12-31 MED ORDER — OXYCODONE-ACETAMINOPHEN 5-325 MG PO TABS: 1.0000 | ORAL_TABLET | Freq: Three times a day (TID) | ORAL | 0 refills | Status: AC | PRN

## 2019-12-31 MED ORDER — IOHEXOL 300 MG/ML  SOLN
75.0000 mL | Freq: Once | INTRAMUSCULAR | Status: AC | PRN
  Administered 2019-12-31: 75 mL via INTRAVENOUS

## 2019-12-31 MED ORDER — MORPHINE SULFATE (PF) 4 MG/ML IV SOLN
4.0000 mg | Freq: Once | INTRAVENOUS | Status: AC
  Administered 2019-12-31: 14:00:00 4 mg via INTRAVENOUS
  Filled 2019-12-31: qty 1
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
## 2019-12-31 NOTE — ED Notes (Signed)
Pt is still c/o pain .

## 2019-12-31 NOTE — ED Notes (Signed)
Pt c/o chest pain 

## 2020-01-13 ENCOUNTER — Other Ambulatory Visit: Payer: Self-pay

## 2020-01-13 ENCOUNTER — Ambulatory Visit: Payer: PRIVATE HEALTH INSURANCE | Attending: Internal Medicine | Admitting: Internal Medicine

## 2020-01-13 ENCOUNTER — Encounter: Payer: Self-pay | Admitting: Internal Medicine

## 2020-01-13 DIAGNOSIS — S2220XD Unspecified fracture of sternum, subsequent encounter for fracture with routine healing: Secondary | ICD-10-CM | POA: Diagnosis not present
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
## 2020-02-04 ENCOUNTER — Other Ambulatory Visit: Payer: Self-pay

## 2020-02-04 ENCOUNTER — Ambulatory Visit: Payer: PRIVATE HEALTH INSURANCE | Attending: Family Medicine | Admitting: Family Medicine

## 2020-02-04 VITALS — BP 174/107 | HR 98 | Temp 97.3°F | Resp 16 | Ht 66.5 in | Wt 176.4 lb

## 2020-02-04 DIAGNOSIS — S2220XD Unspecified fracture of sternum, subsequent encounter for fracture with routine healing: Secondary | ICD-10-CM | POA: Diagnosis not present

## 2020-02-04 DIAGNOSIS — R0789 Other chest pain: Secondary | ICD-10-CM
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
## 2020-02-09 ENCOUNTER — Other Ambulatory Visit: Payer: Self-pay

## 2020-02-09 ENCOUNTER — Ambulatory Visit (INDEPENDENT_AMBULATORY_CARE_PROVIDER_SITE_OTHER): Payer: BC Managed Care – PPO | Admitting: Orthopaedic Surgery

## 2020-02-09 ENCOUNTER — Encounter: Payer: Self-pay | Admitting: Orthopaedic Surgery

## 2020-02-09 VITALS — Ht 66.5 in | Wt 170.0 lb

## 2020-02-09 DIAGNOSIS — S2222XA Fracture of body of sternum, initial encounter for closed fracture: Secondary | ICD-10-CM | POA: Diagnosis not present
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
## 2020-02-10 ENCOUNTER — Encounter: Payer: Self-pay | Admitting: Family Medicine

## 2020-02-10 ENCOUNTER — Ambulatory Visit (HOSPITAL_COMMUNITY)
Admission: RE | Admit: 2020-02-10 | Discharge: 2020-02-10 | Disposition: A | Discharge status: Home / Self Care | Payer: BC Managed Care – PPO | Source: Ambulatory Visit | Attending: Family Medicine | Admitting: Family Medicine

## 2020-02-10 DIAGNOSIS — R0789 Other chest pain: Secondary | ICD-10-CM | POA: Insufficient documentation

## 2020-02-10 DIAGNOSIS — S2220XD Unspecified fracture of sternum, subsequent encounter for fracture with routine healing: Secondary | ICD-10-CM | POA: Insufficient documentation

## 2020-02-12 ENCOUNTER — Telehealth (INDEPENDENT_AMBULATORY_CARE_PROVIDER_SITE_OTHER): Payer: Self-pay

## 2020-02-12 NOTE — Telephone Encounter (Signed)
-----   Message from Cain Saupe, MD sent at 02/10/2020  5:30 PM EST ----- Previously described nondisplaced sternal fracture is not visible on x-[place]. CXR also shows no active cardiopulmonary disease.
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
